# Patient Record
Sex: Female | Born: 1965 | Race: White | Hispanic: No | Marital: Married | Smoking: Never smoker
Health system: Southern US, Community
[De-identification: ages and names within clinical notes are randomized; demographics above are authoritative.]

## PROBLEM LIST (undated history)

## (undated) DIAGNOSIS — I82409 Acute embolism and thrombosis of unspecified deep veins of unspecified lower extremity: Secondary | ICD-10-CM

## (undated) DIAGNOSIS — I739 Peripheral vascular disease, unspecified: Secondary | ICD-10-CM

## (undated) DIAGNOSIS — E785 Hyperlipidemia, unspecified: Secondary | ICD-10-CM

## (undated) DIAGNOSIS — Z9889 Other specified postprocedural states: Secondary | ICD-10-CM

## (undated) DIAGNOSIS — F419 Anxiety disorder, unspecified: Secondary | ICD-10-CM

## (undated) DIAGNOSIS — R112 Nausea with vomiting, unspecified: Secondary | ICD-10-CM

## (undated) HISTORY — PX: ORIF FOREARM FRACTURE: SHX2124

## (undated) HISTORY — PX: APPENDECTOMY: SHX54

## (undated) HISTORY — DX: Peripheral vascular disease, unspecified: I73.9

---

## 1998-09-02 DIAGNOSIS — E782 Mixed hyperlipidemia: Secondary | ICD-10-CM | POA: Insufficient documentation

## 2006-07-13 ENCOUNTER — Emergency Department (HOSPITAL_COMMUNITY): Admission: EM | Admit: 2006-07-13 | Discharge: 2006-07-13 | Payer: Self-pay | Admitting: Emergency Medicine

## 2007-05-13 ENCOUNTER — Emergency Department (HOSPITAL_COMMUNITY): Admission: EM | Admit: 2007-05-13 | Discharge: 2007-05-14 | Payer: Self-pay | Admitting: Emergency Medicine

## 2008-12-10 ENCOUNTER — Ambulatory Visit: Payer: Self-pay | Admitting: Diagnostic Radiology

## 2008-12-10 ENCOUNTER — Emergency Department (HOSPITAL_BASED_OUTPATIENT_CLINIC_OR_DEPARTMENT_OTHER): Admission: EM | Admit: 2008-12-10 | Discharge: 2008-12-10 | Payer: Self-pay | Admitting: Emergency Medicine

## 2008-12-10 ENCOUNTER — Ambulatory Visit (HOSPITAL_COMMUNITY): Admission: RE | Admit: 2008-12-10 | Discharge: 2008-12-10 | Payer: Self-pay | Admitting: Emergency Medicine

## 2008-12-10 ENCOUNTER — Ambulatory Visit: Payer: Self-pay | Admitting: Vascular Surgery

## 2008-12-10 ENCOUNTER — Encounter (INDEPENDENT_AMBULATORY_CARE_PROVIDER_SITE_OTHER): Payer: Self-pay | Admitting: Emergency Medicine

## 2010-03-17 ENCOUNTER — Emergency Department (HOSPITAL_BASED_OUTPATIENT_CLINIC_OR_DEPARTMENT_OTHER): Admission: EM | Admit: 2010-03-17 | Discharge: 2010-03-17 | Payer: Self-pay | Admitting: Emergency Medicine

## 2010-03-17 ENCOUNTER — Ambulatory Visit: Payer: Self-pay | Admitting: Diagnostic Radiology

## 2010-03-29 ENCOUNTER — Encounter: Admission: RE | Admit: 2010-03-29 | Discharge: 2010-03-29 | Payer: Self-pay | Admitting: Orthopedic Surgery

## 2010-12-12 LAB — BASIC METABOLIC PANEL
BUN: 9 mg/dL (ref 6–23)
Creatinine, Ser: 0.8 mg/dL (ref 0.4–1.2)
GFR calc non Af Amer: >60 mL/min (ref >60)
Glucose, Bld: 136 mg/dL — ABNORMAL HIGH (ref 70–99)
Potassium: 4 mEq/L (ref 3.5–5.1)

## 2010-12-12 LAB — DIFFERENTIAL
Basophils Relative: 0 % (ref 0–1)
Monocytes Absolute: 0.6 10*3/uL (ref 0.1–1.0)
Monocytes Relative: 6 % (ref 3–12)
Neutrophils Relative %: 71 % (ref 43–77)

## 2010-12-12 LAB — D-DIMER, QUANTITATIVE: D-Dimer, Quant: 1.16 ug/mL-FEU — ABNORMAL HIGH (ref 0.00–0.48)

## 2010-12-12 LAB — CBC
HCT: 37.4 % (ref 36.0–46.0)
MCV: 90.7 fL (ref 78.0–100.0)
Platelets: 239 10*3/uL (ref 150–400)
RDW: 12.6 % (ref 11.5–15.5)
WBC: 8.8 10*3/uL (ref 4.0–10.5)

## 2011-09-24 ENCOUNTER — Other Ambulatory Visit: Payer: Self-pay | Admitting: Allergy and Immunology

## 2011-09-24 ENCOUNTER — Ambulatory Visit
Admission: RE | Admit: 2011-09-24 | Discharge: 2011-09-24 | Disposition: A | Discharge status: Home / Self Care | Payer: BC Managed Care – PPO | Source: Ambulatory Visit | Attending: Allergy and Immunology | Admitting: Allergy and Immunology

## 2011-09-24 DIAGNOSIS — R05 Cough: Secondary | ICD-10-CM

## 2011-09-24 DIAGNOSIS — R059 Cough, unspecified: Secondary | ICD-10-CM

## 2012-07-27 DIAGNOSIS — H5210 Myopia, unspecified eye: Secondary | ICD-10-CM | POA: Insufficient documentation

## 2012-08-20 DIAGNOSIS — M25552 Pain in left hip: Secondary | ICD-10-CM | POA: Insufficient documentation

## 2012-10-17 DIAGNOSIS — Z9889 Other specified postprocedural states: Secondary | ICD-10-CM | POA: Insufficient documentation

## 2013-07-30 DIAGNOSIS — J309 Allergic rhinitis, unspecified: Secondary | ICD-10-CM | POA: Insufficient documentation

## 2014-03-07 ENCOUNTER — Other Ambulatory Visit: Payer: Self-pay

## 2014-03-07 DIAGNOSIS — Z1231 Encounter for screening mammogram for malignant neoplasm of breast: Secondary | ICD-10-CM

## 2014-03-29 ENCOUNTER — Ambulatory Visit
Admission: RE | Admit: 2014-03-29 | Discharge: 2014-03-29 | Disposition: A | Discharge status: Home / Self Care | Payer: BC Managed Care – PPO | Source: Ambulatory Visit

## 2014-03-29 ENCOUNTER — Encounter (INDEPENDENT_AMBULATORY_CARE_PROVIDER_SITE_OTHER): Payer: Self-pay

## 2014-03-29 DIAGNOSIS — Z1231 Encounter for screening mammogram for malignant neoplasm of breast: Secondary | ICD-10-CM

## 2014-04-01 DIAGNOSIS — L509 Urticaria, unspecified: Secondary | ICD-10-CM | POA: Insufficient documentation

## 2014-04-01 DIAGNOSIS — E559 Vitamin D deficiency, unspecified: Secondary | ICD-10-CM | POA: Insufficient documentation

## 2014-04-21 DIAGNOSIS — Z880 Allergy status to penicillin: Secondary | ICD-10-CM | POA: Insufficient documentation

## 2014-04-21 DIAGNOSIS — L501 Idiopathic urticaria: Secondary | ICD-10-CM | POA: Insufficient documentation

## 2015-04-27 DIAGNOSIS — H43813 Vitreous degeneration, bilateral: Secondary | ICD-10-CM | POA: Insufficient documentation

## 2015-05-05 DIAGNOSIS — E785 Hyperlipidemia, unspecified: Secondary | ICD-10-CM | POA: Insufficient documentation

## 2015-06-01 DIAGNOSIS — H35413 Lattice degeneration of retina, bilateral: Secondary | ICD-10-CM | POA: Insufficient documentation

## 2015-06-01 DIAGNOSIS — H47323 Drusen of optic disc, bilateral: Secondary | ICD-10-CM | POA: Insufficient documentation

## 2015-06-01 DIAGNOSIS — H35372 Puckering of macula, left eye: Secondary | ICD-10-CM | POA: Insufficient documentation

## 2017-02-28 ENCOUNTER — Other Ambulatory Visit: Payer: Self-pay | Admitting: Neurological Surgery

## 2017-02-28 DIAGNOSIS — M545 Low back pain: Secondary | ICD-10-CM

## 2017-03-14 ENCOUNTER — Ambulatory Visit
Admission: RE | Admit: 2017-03-14 | Discharge: 2017-03-14 | Disposition: A | Discharge status: Home / Self Care | Payer: BLUE CROSS/BLUE SHIELD | Source: Ambulatory Visit | Attending: Neurological Surgery | Admitting: Neurological Surgery

## 2017-03-14 DIAGNOSIS — M545 Low back pain: Secondary | ICD-10-CM

## 2017-03-20 ENCOUNTER — Other Ambulatory Visit: Payer: Self-pay | Admitting: Physical Medicine and Rehabilitation

## 2017-03-20 DIAGNOSIS — M7072 Other bursitis of hip, left hip: Secondary | ICD-10-CM

## 2017-03-24 ENCOUNTER — Ambulatory Visit
Admission: RE | Admit: 2017-03-24 | Discharge: 2017-03-24 | Disposition: A | Discharge status: Home / Self Care | Payer: BLUE CROSS/BLUE SHIELD | Source: Ambulatory Visit | Attending: Physical Medicine and Rehabilitation | Admitting: Physical Medicine and Rehabilitation

## 2017-03-24 DIAGNOSIS — M7072 Other bursitis of hip, left hip: Secondary | ICD-10-CM

## 2017-06-17 DIAGNOSIS — Z86718 Personal history of other venous thrombosis and embolism: Secondary | ICD-10-CM | POA: Insufficient documentation

## 2017-06-17 DIAGNOSIS — I82402 Acute embolism and thrombosis of unspecified deep veins of left lower extremity: Secondary | ICD-10-CM | POA: Insufficient documentation

## 2017-06-19 DIAGNOSIS — I82409 Acute embolism and thrombosis of unspecified deep veins of unspecified lower extremity: Secondary | ICD-10-CM | POA: Insufficient documentation

## 2017-06-19 DIAGNOSIS — I87002 Postthrombotic syndrome without complications of left lower extremity: Secondary | ICD-10-CM | POA: Insufficient documentation

## 2017-06-25 DIAGNOSIS — Z7901 Long term (current) use of anticoagulants: Secondary | ICD-10-CM | POA: Insufficient documentation

## 2017-10-06 DIAGNOSIS — E669 Obesity, unspecified: Secondary | ICD-10-CM | POA: Insufficient documentation

## 2017-10-06 DIAGNOSIS — G589 Mononeuropathy, unspecified: Secondary | ICD-10-CM | POA: Insufficient documentation

## 2017-10-29 ENCOUNTER — Other Ambulatory Visit: Payer: Self-pay | Admitting: Neurology

## 2017-10-29 DIAGNOSIS — G509 Disorder of trigeminal nerve, unspecified: Secondary | ICD-10-CM

## 2017-11-12 ENCOUNTER — Ambulatory Visit
Admission: RE | Admit: 2017-11-12 | Discharge: 2017-11-12 | Disposition: A | Discharge status: Home / Self Care | Payer: BLUE CROSS/BLUE SHIELD | Source: Ambulatory Visit | Attending: Chiropractic Medicine | Admitting: Chiropractic Medicine

## 2017-11-12 DIAGNOSIS — G509 Disorder of trigeminal nerve, unspecified: Secondary | ICD-10-CM

## 2017-11-12 MED ORDER — GADOBENATE DIMEGLUMINE 529 MG/ML IV SOLN
20.0000 mL | Freq: Once | INTRAVENOUS | Status: AC | PRN
  Administered 2017-11-12: 20 mL via INTRAVENOUS

## 2018-04-09 DIAGNOSIS — M816 Localized osteoporosis [Lequesne]: Secondary | ICD-10-CM | POA: Insufficient documentation

## 2019-02-16 ENCOUNTER — Telehealth: Payer: Self-pay | Admitting: Hematology

## 2019-02-16 NOTE — Telephone Encounter (Signed)
Called and spoke with patient regarding appointments/ Patient is aware of date/time/location

## 2019-02-18 NOTE — Progress Notes (Signed)
[place] NOTE  Patient Care Team: Jamesetta Geralds, MD as PCP - General (Family Medicine)  HEME/ONC OVERVIEW: 1. Hx of provoked LLE DVT  -06/2017: acute DVT involving L popliteal vein and the calf vessels, provoked in the setting of OCP use; treated with Xarelto  -12/2017: stranding in the L popliteal vein on doppler  -10/2018: no evidence of residual DVT or stranding in the LLE  TREATMENT REGIMEN:  06/2017 - present: Xarelto; on 10mg  daily since 03/2019 for secondary prophylaxis   ASSESSMENT & PLAN:   Hx of LLE DVT  -I reviewed the patient's records in detail, including external clinic notes, lab studies, and imaging results -In summary, patient presented for new onset left lower extremity swelling in late 2018, and Doppler in 06/2017 showed acute DVT involving the left popliteal vein and calf vessels.  The patient was started on Xarelto and has been on 20mg  daily since then.  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  -I reviewed with the patient about the plan for care for DVT -This last episode of blood clot appeared to be provoked in the setting of OCP use.  While the duration of anticoagulation for the 1st episode of provoked VTE is generally finite, the patient has severe post-phlebitis syndrome, which can increase the risk of recurrent DVT.  Furthermore, if she develops a recurrent episode of DVT, it can further worsen the post-phlebitis syndrome and cause significant impact on her quality of life.  Therefore, it would be  reasonable to continue anticoagulation indefinitely in her case. -We reviewed the literature on the dosing management of Xarelto, including reducing the dose of Xarelto by half (ie 10mg  daily) for secondary prophylaxis -After extensive discussions regarding the pros and cons of the lower dose of Xarelto for secondary prophylaxis, patient expressed understanding and agreed to reduce the dose of Xarelto to 10mg  daily -I recommend the patient to continue to use elastic compression stockings at 20-30 mmHg to reduce risks of chronic thrombophlebitis. -Finally, I reinforced the importance of preventive strategies such as avoiding hormonal supplement, avoiding cigarette smoking, keeping up-to-date with screening programs for early cancer detection, frequent ambulation for long distance travel and aggressive DVT prophylaxis in all surgical settings. -Should she need any interruption of the anticoagulation for elective procedures in the future, feel free to contact me regarding peri-operative management.  Orders Placed This Encounter  Procedures  . CBC with Differential (Cancer Center Only)    Standing Status:   Future    Standing Expiration Date:   04/01/2020  . CMP ([facility] only)    Standing Status:   Future    Standing Expiration Date:   04/01/2020   All questions were answered. The patient knows to call the clinic with any problems, questions or concerns.  Return in 6 months for labs and clinic follow-up.   Tish Men, MD 02/26/2019 9:42 AM   CHIEF COMPLAINTS/PURPOSE OF CONSULTATION:  "I am about the same"  HISTORY OF PRESENTING ILLNESS:  Monica Perez 53 y.o. female is here because of history of LLE DVT.  Patient reports that she was diagnosed with left  lower extremity DVT in May 2018 after she had several weeks of left heel pain.  She was started on Xarelto at that time and has remained on it since then without any abnormal bleeding or bruising.  However, despite resolution of the DVT,  she has developed severe postphlebitic syndrome, for which she has been [facility] for possible vein stripping/ablation.  Unfortunately, both institutions felt that she was not a surgical candidate due to the location of the venous insufficiency.  She has tried a variety of supportive care measures, including compression stockings and electronic compression device, without significant improvement.  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  As a result, she has to keep her left leg elevated throughout the weekend for the swelling to go down before she goes back to work on Monday.  She denies any complaint today.  MEDICAL HISTORY:  History reviewed. No pertinent past medical history.  SURGICAL HISTORY: History reviewed. No pertinent surgical history.  SOCIAL HISTORY: Social History   Socioeconomic History  . Marital status: Married    Spouse name: Not on file  . Number of children: Not on file  . Years of education: Not on file  . Highest education level: Not on file  Occupational History  . Not on file  Social Needs  . Financial resource strain: Not on file  . Food insecurity    Worry: Not on file    Inability: Not on file  . Transportation needs    Medical: Not on file    Non-medical: Not on file  Tobacco Use  . Smoking status: Never Smoker  . Smokeless tobacco: Never Used  Substance and Sexual Activity  . Alcohol use: Not Currently  . Drug use: Never  . Sexual activity: Not on file  Lifestyle  . Physical activity    Days per week: Not on file    Minutes per session: Not on file  . Stress: Not on file  Relationships  . Social Musicianconnections    Talks on phone: Not on file    Gets together: Not on file    Attends religious service: Not on file    Active member of club or organization:  Not on file    Attends meetings of clubs or organizations: Not on file    Relationship status: Not on file  . Intimate partner violence    Fear of current or ex partner: Not on file    Emotionally abused: Not on file    Physically abused: Not on file    Forced sexual activity: Not on file  Other Topics Concern  . Not on file  Social History Narrative  . Not on file    FAMILY HISTORY: History reviewed. No pertinent family history.  ALLERGIES:  is allergic to aspirin; atovaquone-proguanil hcl; other; penicillins; erythromycin base; morphine and related; sulfa antibiotics; and sulfamethoxazole-trimethoprim.  MEDICATIONS:  Current Outpatient Medications  Medication Sig Dispense Refill  . desvenlafaxine (PRISTIQ) 50 MG 24 hr tablet Take by mouth.    . Dietary Management Product (VASCULERA PO) Take 1 tablet by mouth daily.    Marland Kitchen. lovastatin (MEVACOR) 20 MG tablet Take 1 tablet by mouth daily.    . Multiple Vitamin (MULTI-VITAMIN DAILY PO) Take 1 tablet by mouth daily.    Marland Kitchen. VITAMIN D,  CHOLECALCIFEROL, PO Take 4,000 Units by mouth daily.    Carlena Hurl. XARELTO 20 MG TABS tablet Take 1 tablet by mouth daily.     No current facility-administered medications for this visit.     REVIEW OF SYSTEMS:   Constitutional: ( - ) fevers, ( - )  chills , ( - ) night sweats Eyes: ( - ) blurriness of vision, ( - ) double vision, ( - ) watery eyes Ears, nose, mouth, throat, and face: ( - ) mucositis, ( - ) sore throat Respiratory: ( - ) cough, ( - ) dyspnea, ( - ) wheezes Cardiovascular: ( - ) palpitation, ( - ) chest discomfort, ( + ) lower extremity swelling Gastrointestinal:  ( - ) nausea, ( - ) heartburn, ( - ) change in bowel habits Skin: ( - ) abnormal skin rashes Lymphatics: ( - ) new lymphadenopathy, ( - ) easy bruising Neurological: ( - ) numbness, ( - ) tingling, ( - ) new weaknesses Behavioral/Psych: ( - ) mood change, ( - ) new changes  All other systems were reviewed with the patient and are  negative.  PHYSICAL EXAMINATION: ECOG PERFORMANCE STATUS: 1 - Symptomatic but completely ambulatory  Vitals:   02/26/19 0923  BP: 117/82  Pulse: 87  Resp: 18  Temp: 98 F (36.7 C)  SpO2: 100%   Filed Weights   02/26/19 0923  Weight: 219 lb 12.8 oz (99.7 kg)    GENERAL: alert, no distress and comfortable SKIN: skin color, texture, turgor are normal, no rashes or significant lesions EYES: conjunctiva are pink and non-injected, sclera clear OROPHARYNX: no exudate, no erythema; lips, buccal mucosa, and tongue normal  NECK: supple, non-tender LUNGS: clear to auscultation with normal breathing effort HEART: regular rate & rhythm, no murmurs, no lower extremity edema ABDOMEN: soft, non-tender, non-distended, normal bowel sounds Musculoskeletal: no cyanosis of digits and no clubbing  PSYCH: alert & oriented x 3, fluent speech NEURO: no focal motor/sensory deficits  LABORATORY DATA:  I have reviewed the data as listed Lab Results  Component Value Date   WBC 8.9 02/26/2019   HGB 13.9 02/26/2019   HCT 42.0 02/26/2019   MCV 93.8 02/26/2019   PLT 292 02/26/2019   Lab Results  Component Value Date   NA 139 02/26/2019   K 4.4 02/26/2019   CL 103 02/26/2019   CO2 28 02/26/2019   PATHOLOGY: I have reviewed the pathology reports as documented in the oncologist history.

## 2019-02-25 ENCOUNTER — Other Ambulatory Visit: Payer: BLUE CROSS/BLUE SHIELD

## 2019-02-25 ENCOUNTER — Other Ambulatory Visit: Payer: Self-pay | Admitting: Hematology

## 2019-02-25 ENCOUNTER — Ambulatory Visit: Payer: BLUE CROSS/BLUE SHIELD | Admitting: Hematology

## 2019-02-25 DIAGNOSIS — Z86718 Personal history of other venous thrombosis and embolism: Secondary | ICD-10-CM

## 2019-02-26 ENCOUNTER — Inpatient Hospital Stay (HOSPITAL_BASED_OUTPATIENT_CLINIC_OR_DEPARTMENT_OTHER): Payer: BC Managed Care – PPO | Admitting: Hematology

## 2019-02-26 ENCOUNTER — Telehealth: Payer: Self-pay | Admitting: Hematology

## 2019-02-26 ENCOUNTER — Inpatient Hospital Stay: Payer: BC Managed Care – PPO | Attending: Hematology

## 2019-02-26 ENCOUNTER — Encounter: Payer: Self-pay | Admitting: Hematology

## 2019-02-26 ENCOUNTER — Other Ambulatory Visit: Payer: Self-pay

## 2019-02-26 VITALS — BP 117/82 | HR 87 | Temp 98.0°F | Resp 18 | Ht 66.0 in | Wt 219.8 lb

## 2019-02-26 DIAGNOSIS — Z7901 Long term (current) use of anticoagulants: Secondary | ICD-10-CM | POA: Insufficient documentation

## 2019-02-26 DIAGNOSIS — I87002 Postthrombotic syndrome without complications of left lower extremity: Secondary | ICD-10-CM | POA: Diagnosis not present

## 2019-02-26 DIAGNOSIS — Z86718 Personal history of other venous thrombosis and embolism: Secondary | ICD-10-CM | POA: Diagnosis present

## 2019-02-26 LAB — CBC WITH DIFFERENTIAL (CANCER CENTER ONLY)
Abs Immature Granulocytes: 0.02 10*3/uL (ref 0.00–0.07)
Basophils Absolute: 0.1 10*3/uL (ref 0.0–0.1)
Basophils Relative: 1 %
Eosinophils Absolute: 0.2 10*3/uL (ref 0.0–0.5)
Eosinophils Relative: 3 %
HCT: 42 % (ref 36.0–46.0)
Hemoglobin: 13.9 g/dL (ref 12.0–15.0)
Immature Granulocytes: 0 %
Lymphocytes Relative: 32 %
Lymphs Abs: 2.9 10*3/uL (ref 0.7–4.0)
MCH: 31 pg (ref 26.0–34.0)
MCHC: 33.1 g/dL (ref 30.0–36.0)
MCV: 93.8 fL (ref 80.0–100.0)
Monocytes Absolute: 0.8 10*3/uL (ref 0.1–1.0)
Monocytes Relative: 9 %
Neutro Abs: 4.9 10*3/uL (ref 1.7–7.7)
Neutrophils Relative %: 55 %
Platelet Count: 292 10*3/uL (ref 150–400)
RBC: 4.48 MIL/uL (ref 3.87–5.11)
RDW: 13.1 % (ref 11.5–15.5)
WBC Count: 8.9 10*3/uL (ref 4.0–10.5)
nRBC: 0 % (ref 0.0–0.2)

## 2019-02-26 LAB — CMP (CANCER CENTER ONLY)
ALT: 28 U/L (ref 0–44)
AST: 23 U/L (ref 15–41)
Albumin: 4.4 g/dL (ref 3.5–5.0)
Alkaline Phosphatase: 67 U/L (ref 38–126)
Anion gap: 8 (ref 5–15)
BUN: 22 mg/dL — ABNORMAL HIGH (ref 6–20)
CO2: 28 mmol/L (ref 22–32)
Calcium: 10.1 mg/dL (ref 8.9–10.3)
Chloride: 103 mmol/L (ref 98–111)
Creatinine: 0.93 mg/dL (ref 0.44–1.00)
GFR, Est AFR Am: >60 mL/min (ref >60)
GFR, Estimated: >60 mL/min (ref >60)
Glucose, Bld: 111 mg/dL — ABNORMAL HIGH (ref 70–99)
Potassium: 4.4 mmol/L (ref 3.5–5.1)
Sodium: 139 mmol/L (ref 135–145)
Total Bilirubin: 0.8 mg/dL (ref 0.3–1.2)
Total Protein: 7.2 g/dL (ref 6.5–8.1)

## 2019-02-26 LAB — D-DIMER, QUANTITATIVE: D-Dimer, Quant: <0.27 ug/mL-FEU (ref 0.00–0.50)

## 2019-02-26 LAB — SAVE SMEAR (SSMR)

## 2019-02-26 MED ORDER — RIVAROXABAN 10 MG PO TABS: 10.0000 mg | ORAL_TABLET | Freq: Every day | ORAL | 11 refills | Status: DC

## 2019-02-26 NOTE — Telephone Encounter (Signed)
Called and LMVM for patient with date/tim of next appointment per 6/26 los

## 2019-03-01 ENCOUNTER — Other Ambulatory Visit: Payer: Self-pay | Admitting: *Deleted

## 2019-03-01 MED ORDER — RIVAROXABAN 10 MG PO TABS: 10.0000 mg | ORAL_TABLET | Freq: Every day | ORAL | 3 refills | Status: DC

## 2019-08-13 ENCOUNTER — Other Ambulatory Visit: Payer: Self-pay

## 2019-08-13 ENCOUNTER — Inpatient Hospital Stay (HOSPITAL_BASED_OUTPATIENT_CLINIC_OR_DEPARTMENT_OTHER): Payer: BC Managed Care – PPO | Admitting: Family

## 2019-08-13 ENCOUNTER — Inpatient Hospital Stay: Payer: BC Managed Care – PPO | Attending: Family

## 2019-08-13 VITALS — BP 108/85 | HR 102 | Temp 97.3°F | Resp 18 | Ht 66.0 in | Wt 226.0 lb

## 2019-08-13 DIAGNOSIS — Z86718 Personal history of other venous thrombosis and embolism: Secondary | ICD-10-CM

## 2019-08-13 DIAGNOSIS — Z7901 Long term (current) use of anticoagulants: Secondary | ICD-10-CM | POA: Diagnosis not present

## 2019-08-13 LAB — CBC WITH DIFFERENTIAL (CANCER CENTER ONLY)
Abs Immature Granulocytes: 0.03 10*3/uL (ref 0.00–0.07)
Basophils Absolute: 0.1 10*3/uL (ref 0.0–0.1)
Basophils Relative: 1 %
Eosinophils Absolute: 0.2 10*3/uL (ref 0.0–0.5)
Eosinophils Relative: 3 %
HCT: 43.7 % (ref 36.0–46.0)
Hemoglobin: 14.4 g/dL (ref 12.0–15.0)
Immature Granulocytes: 0 %
Lymphocytes Relative: 31 %
Lymphs Abs: 2.6 10*3/uL (ref 0.7–4.0)
MCH: 31.2 pg (ref 26.0–34.0)
MCHC: 33 g/dL (ref 30.0–36.0)
MCV: 94.8 fL (ref 80.0–100.0)
Monocytes Absolute: 0.7 10*3/uL (ref 0.1–1.0)
Monocytes Relative: 9 %
Neutro Abs: 4.6 10*3/uL (ref 1.7–7.7)
Neutrophils Relative %: 56 %
Platelet Count: 336 10*3/uL (ref 150–400)
RBC: 4.61 MIL/uL (ref 3.87–5.11)
RDW: 13.7 % (ref 11.5–15.5)
WBC Count: 8.2 10*3/uL (ref 4.0–10.5)
nRBC: 0 % (ref 0.0–0.2)

## 2019-08-13 LAB — CMP (CANCER CENTER ONLY)
ALT: 47 U/L — ABNORMAL HIGH (ref 0–44)
AST: 33 U/L (ref 15–41)
Albumin: 5.1 g/dL — ABNORMAL HIGH (ref 3.5–5.0)
Alkaline Phosphatase: 68 U/L (ref 38–126)
Anion gap: 9 (ref 5–15)
BUN: 18 mg/dL (ref 6–20)
CO2: 28 mmol/L (ref 22–32)
Calcium: 10.4 mg/dL — ABNORMAL HIGH (ref 8.9–10.3)
Chloride: 102 mmol/L (ref 98–111)
Creatinine: 0.99 mg/dL (ref 0.44–1.00)
GFR, Est AFR Am: >60 mL/min (ref >60)
GFR, Estimated: >60 mL/min (ref >60)
Glucose, Bld: 113 mg/dL — ABNORMAL HIGH (ref 70–99)
Potassium: 4.3 mmol/L (ref 3.5–5.1)
Sodium: 139 mmol/L (ref 135–145)
Total Bilirubin: 0.6 mg/dL (ref 0.3–1.2)
Total Protein: 7.8 g/dL (ref 6.5–8.1)

## 2019-08-13 MED ORDER — RIVAROXABAN 10 MG PO TABS: 10.0000 mg | ORAL_TABLET | Freq: Every day | ORAL | 6 refills | Status: DC

## 2019-08-13 NOTE — Progress Notes (Signed)
Hematology and Oncology Follow Up Visit  Monica Perez [id number] 07-09-1966 53 y.o. 08/13/2019   Principle Diagnosis:  History of left lower extremity DVT with stranding in the left popliteal vein - secondary to OCP  Current Therapy:   Maintenance Xarelto 10 mg PO daily - started 03/2019   Interim History:  [name] is here today for follow-up. She is doing fairly well but has chronic pain and swelling in the right leg that waxes and wanes throughout the day due to postphlebitic syndrome. She states that she has seen vascular and is not a candidate for surgery.  Pedal pulses are 2+. She verbalized that she is taking Xarelto 10 mg PO daily as prescribed. No episodes of bleeding. No bruising or petechiae.  No fever, chills, n/v, cough, rash, dizziness, SOB, chest pain, palpitations, abdominal pain or changes in bowel or bladder habits.   No falls or syncopal episodes.  She has maintained a good appetite and is hydrating well. Her weight is stable at 226 lbs.   ECOG Performance Status: 1 - Symptomatic but completely ambulatory  Medications:  Allergies as of 08/13/2019      Reactions   Aspirin Hives   Atovaquone-proguanil Hcl Nausea Only   Other Hives   Surgical glue   Penicillins Hives, Rash      Erythromycin Base Hives, Rash      Morphine And Related Hives, Rash   Sulfa Antibiotics Hives, Rash      Sulfamethoxazole-trimethoprim Hives, Rash         Medication List       Accurate as of August 13, 2019  8:42 AM. If you have any questions, ask your nurse or doctor.        lovastatin 20 MG tablet Commonly known as: MEVACOR Take 1 tablet by mouth daily.   MULTI-VITAMIN DAILY PO Take 1 tablet by mouth daily.   Pristiq 50 MG 24 hr tablet Generic drug: desvenlafaxine Take by mouth.   rivaroxaban 10 MG Tabs tablet Commonly known as: Xarelto Take 1 tablet (10 mg total) by mouth daily.   VITAMIN D (CHOLECALCIFEROL) PO Take 4,000 Units by mouth daily.         Allergies:  Allergies  Allergen Reactions  . Aspirin Hives  . Atovaquone-Proguanil Hcl Nausea Only  . Other Hives    Surgical glue  . Penicillins Hives and Rash       . Erythromycin Base Hives and Rash       . Morphine And Related Hives and Rash  . Sulfa Antibiotics Hives and Rash       . Sulfamethoxazole-Trimethoprim Hives and Rash         Past Medical History, Surgical history, Social history, and Family History were reviewed and updated.  Review of Systems: All other 10 point review of systems is negative.   Physical Exam:  vitals were not taken for this visit.   Wt Readings from Last 3 Encounters:  02/26/19 219 lb 12.8 oz (99.7 kg)    Ocular: Sclerae unicteric, pupils equal, round and reactive to light Ear-nose-throat: Oropharynx clear, dentition fair Lymphatic: No cervical or supraclavicular adenopathy Lungs no rales or rhonchi, good excursion bilaterally Heart regular rate and rhythm, no murmur appreciated Abd soft, nontender, positive bowel sounds, no liver or spleen tip noted on exam, no fluid wave MSK no focal spinal tenderness, no joint edema Neuro: non-focal, well-oriented, appropriate affect Breasts: Deferred  Lab Results  Component Value Date   WBC 8.2 08/13/2019   HGB  14.4 08/13/2019   HCT 43.7 08/13/2019   MCV 94.8 08/13/2019   PLT 336 08/13/2019   No results found for: FERRITIN, IRON, TIBC, UIBC, IRONPCTSAT Lab Results  Component Value Date   RBC 4.61 08/13/2019   No results found for: KPAFRELGTCHN, LAMBDASER, KAPLAMBRATIO No results found for: IGGSERUM, IGA, IGMSERUM No results found for: Marda Stalker, SPEI   Chemistry      Component Value Date/Time   NA 139 02/26/2019 0829   K 4.4 02/26/2019 0829   CL 103 02/26/2019 0829   CO2 28 02/26/2019 0829   BUN 22 (H) 02/26/2019 0829   CREATININE 0.93 02/26/2019 0829      Component Value Date/Time   CALCIUM 10.1 02/26/2019 0829    ALKPHOS 67 02/26/2019 0829   AST 23 02/26/2019 0829   ALT 28 02/26/2019 0829   BILITOT 0.8 02/26/2019 0829       Impression and Plan: [name] is a very pleasant 53 yo caucasian female with history of LLE DVT and with stranding in the left popliteal vein, secondary to OCP.  She continues to do well and is following up with vascular for post phlebitic pain and swelling.  She does not feel that she needs to continue to come here as it is very expensive for her. She will continue to see vascular and her PCP. They will take over her Xarelto prescription refills and management after today.   She will contact our office with any questions or concerns and is always welcome to return here for care if needed.   Emeline Gins, NP 12/11/20208:42 AM

## 2019-08-19 DIAGNOSIS — Z5320 Procedure and treatment not carried out because of patient's decision for unspecified reasons: Secondary | ICD-10-CM | POA: Insufficient documentation

## 2019-08-20 ENCOUNTER — Other Ambulatory Visit: Payer: BLUE CROSS/BLUE SHIELD

## 2019-08-20 ENCOUNTER — Ambulatory Visit: Payer: BLUE CROSS/BLUE SHIELD | Admitting: Hematology

## 2020-02-02 DIAGNOSIS — M25512 Pain in left shoulder: Secondary | ICD-10-CM | POA: Insufficient documentation

## 2020-03-15 DIAGNOSIS — F419 Anxiety disorder, unspecified: Secondary | ICD-10-CM | POA: Insufficient documentation

## 2020-06-15 ENCOUNTER — Other Ambulatory Visit: Payer: Self-pay | Admitting: Family

## 2020-06-15 DIAGNOSIS — Z86718 Personal history of other venous thrombosis and embolism: Secondary | ICD-10-CM

## 2020-06-16 ENCOUNTER — Other Ambulatory Visit: Payer: Self-pay | Admitting: *Deleted

## 2020-06-16 ENCOUNTER — Inpatient Hospital Stay: Payer: BC Managed Care – PPO

## 2020-06-16 ENCOUNTER — Inpatient Hospital Stay: Payer: BC Managed Care – PPO | Attending: Family | Admitting: Family

## 2020-06-16 ENCOUNTER — Other Ambulatory Visit: Payer: Self-pay

## 2020-06-16 ENCOUNTER — Telehealth: Payer: Self-pay | Admitting: Family

## 2020-06-16 ENCOUNTER — Encounter: Payer: Self-pay | Admitting: Family

## 2020-06-16 VITALS — BP 130/88 | HR 85 | Temp 99.1°F | Resp 18 | Ht 66.0 in | Wt 230.0 lb

## 2020-06-16 DIAGNOSIS — Z86718 Personal history of other venous thrombosis and embolism: Secondary | ICD-10-CM

## 2020-06-16 DIAGNOSIS — Z7901 Long term (current) use of anticoagulants: Secondary | ICD-10-CM | POA: Diagnosis not present

## 2020-06-16 DIAGNOSIS — I87009 Postthrombotic syndrome without complications of unspecified extremity: Secondary | ICD-10-CM | POA: Diagnosis not present

## 2020-06-16 LAB — CMP (CANCER CENTER ONLY)
ALT: 64 U/L — ABNORMAL HIGH (ref 0–44)
AST: 32 U/L (ref 15–41)
Albumin: 4.2 g/dL (ref 3.5–5.0)
Alkaline Phosphatase: 69 U/L (ref 38–126)
Anion gap: 7 (ref 5–15)
BUN: 13 mg/dL (ref 6–20)
CO2: 29 mmol/L (ref 22–32)
Calcium: 10 mg/dL (ref 8.9–10.3)
Chloride: 106 mmol/L (ref 98–111)
Creatinine: 0.87 mg/dL (ref 0.44–1.00)
GFR, Estimated: >60 mL/min (ref >60)
Glucose, Bld: 138 mg/dL — ABNORMAL HIGH (ref 70–99)
Potassium: 4.1 mmol/L (ref 3.5–5.1)
Sodium: 142 mmol/L (ref 135–145)
Total Bilirubin: 0.6 mg/dL (ref 0.3–1.2)
Total Protein: 6.5 g/dL (ref 6.5–8.1)

## 2020-06-16 LAB — CBC WITH DIFFERENTIAL (CANCER CENTER ONLY)
Abs Immature Granulocytes: 0.04 10*3/uL (ref 0.00–0.07)
Basophils Absolute: 0.1 10*3/uL (ref 0.0–0.1)
Basophils Relative: 1 %
Eosinophils Absolute: 0.3 10*3/uL (ref 0.0–0.5)
Eosinophils Relative: 4 %
HCT: 40.8 % (ref 36.0–46.0)
Hemoglobin: 13.1 g/dL (ref 12.0–15.0)
Immature Granulocytes: 1 %
Lymphocytes Relative: 26 %
Lymphs Abs: 2.3 10*3/uL (ref 0.7–4.0)
MCH: 30.7 pg (ref 26.0–34.0)
MCHC: 32.1 g/dL (ref 30.0–36.0)
MCV: 95.6 fL (ref 80.0–100.0)
Monocytes Absolute: 0.7 10*3/uL (ref 0.1–1.0)
Monocytes Relative: 8 %
Neutro Abs: 5.5 10*3/uL (ref 1.7–7.7)
Neutrophils Relative %: 60 %
Platelet Count: 243 10*3/uL (ref 150–400)
RBC: 4.27 MIL/uL (ref 3.87–5.11)
RDW: 13.2 % (ref 11.5–15.5)
WBC Count: 8.8 10*3/uL (ref 4.0–10.5)
nRBC: 0 % (ref 0.0–0.2)

## 2020-06-16 LAB — D-DIMER, QUANTITATIVE: D-Dimer, Quant: <0.27 ug/mL-FEU (ref 0.00–0.50)

## 2020-06-16 MED ORDER — RIVAROXABAN 10 MG PO TABS: 10.0000 mg | ORAL_TABLET | Freq: Every day | ORAL | 6 refills | Status: DC

## 2020-06-16 NOTE — Telephone Encounter (Signed)
Appointments scheduled calendar printed & mailed per 10/15 los 

## 2020-06-16 NOTE — Progress Notes (Signed)
Hematology and Oncology Follow Up Visit  Monica Perez [id number] 26-Dec-1965 54 y.o. 06/16/2020   Principle Diagnosis:  History of left lower extremity DVT with stranding in the left popliteal vein - secondary to OCP  Current Therapy:        Maintenance Xarelto 10 mg PO daily - started 03/2019   Interim History:  Monica Perez is here today for follow-up. She is still having a lot of problems with post phlebitic pain and swelling in her left leg. 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  She wears her compression stockings, elevates her leg when able and also has a lymphedema machine that she uses at night. These only give temporary relief. She states that she has pain in the ankle 24/7 and then intermittent pain from the knee down.  She is doing well on maintenance Xarelto and verbalized that she is taking as prescribed.  No episodes of bleeding. No bruising or petechiae.   No numbness or tingling in her extremities at this time.  Pedal pulses are 3+.  No falls or syncope.  No fever, chills, n/v, cough, rash, dizziness, SOB, chest pain, palpitations, abdominal pain or changes in bowel or bladder habits.  She has maintained a good appetite and is staying well hydrated. Her weight is stable.   ECOG Performance Status: 1 - Symptomatic but completely ambulatory  Medications:  Allergies as of 06/16/2020      Reactions   Aspirin Hives   Atovaquone-proguanil Hcl Nausea Only   Other Hives   Surgical glue   Penicillins Hives, Rash      Erythromycin Base Hives, Rash      Morphine And Related Hives, Rash   Sulfa Antibiotics Hives, Rash      Sulfamethoxazole-trimethoprim Hives, Rash         Medication List       Accurate as of June 16, 2020  9:47 AM. If you have any questions, ask your nurse or doctor.        desvenlafaxine 100 MG 24 hr tablet Commonly known as: PRISTIQ Take  100 mg by mouth daily.   lovastatin 20 MG tablet Commonly known as: MEVACOR Take 1 tablet by mouth daily.   MULTI-VITAMIN DAILY PO Take 1 tablet by mouth daily.   rivaroxaban 10 MG Tabs tablet Commonly known as: Xarelto Take 1 tablet (10 mg total) by mouth daily.   VITAMIN D (CHOLECALCIFEROL) PO Take 4,000 Units by mouth daily.       Allergies:  Allergies  Allergen Reactions  . Aspirin Hives  . Atovaquone-Proguanil Hcl Nausea Only  . Other Hives    Surgical glue  . Penicillins Hives and Rash       . Erythromycin Base Hives and Rash       . Morphine And Related Hives and Rash  . Sulfa Antibiotics Hives and Rash       . Sulfamethoxazole-Trimethoprim Hives and Rash         Past Medical History, Surgical history, Social history, and Family History were reviewed and updated.  Review of Systems: All other 10 point review of systems is negative.   Physical Exam:  height is 5\' 6"  (1.676 m) and weight is 230 lb (104.3 kg). Her oral temperature is 99.1 F (37.3 C). Her blood pressure is 130/88 and her pulse is 85. Her respiration is 18 and oxygen saturation is 98%.   Wt Readings from  Last 3 Encounters:  06/16/20 230 lb (104.3 kg)  08/13/19 226 lb (102.5 kg)  02/26/19 219 lb 12.8 oz (99.7 kg)    Ocular: Sclerae unicteric, pupils equal, round and reactive to light Ear-nose-throat: Oropharynx clear, dentition fair Lymphatic: No cervical or supraclavicular adenopathy Lungs no rales or rhonchi, good excursion bilaterally Heart regular rate and rhythm, no murmur appreciated Abd soft, nontender, positive bowel sounds MSK no focal spinal tenderness, no joint edema Neuro: non-focal, well-oriented, appropriate affect Breasts: Deferred   Lab Results  Component Value Date   WBC 8.8 06/16/2020   HGB 13.1 06/16/2020   HCT 40.8 06/16/2020   MCV 95.6 06/16/2020   PLT 243 06/16/2020   No results found for: FERRITIN, IRON, TIBC, UIBC, IRONPCTSAT Lab Results  Component  Value Date   RBC 4.27 06/16/2020   No results found for: KPAFRELGTCHN, LAMBDASER, KAPLAMBRATIO No results found for: IGGSERUM, IGA, IGMSERUM No results found for: Marda Stalker, SPEI   Chemistry      Component Value Date/Time   NA 139 08/13/2019 0821   K 4.3 08/13/2019 0821   CL 102 08/13/2019 0821   CO2 28 08/13/2019 0821   BUN 18 08/13/2019 0821   CREATININE 0.99 08/13/2019 0821      Component Value Date/Time   CALCIUM 10.4 (H) 08/13/2019 0821   ALKPHOS 68 08/13/2019 0821   AST 33 08/13/2019 0821   ALT 47 (H) 08/13/2019 0821   BILITOT 0.6 08/13/2019 0821       Impression and Plan: Monica Perez is a very pleasant 54 yo caucasian female with history of LLE DVT and with stranding in the left popliteal vein, secondary to OCP.  Referral placed for [facility]. Hopefully they will be able to offer her some options to treat the post phlebitis pain and swelling.  Xarelto refilled.  Follow-up in 6 months.  She can contact our office with any questions or concerns.   Emeline Gins, NP 10/15/20219:47 AM

## 2020-06-28 DIAGNOSIS — M21621 Bunionette of right foot: Secondary | ICD-10-CM | POA: Insufficient documentation

## 2020-06-29 ENCOUNTER — Other Ambulatory Visit (HOSPITAL_COMMUNITY): Payer: Self-pay | Admitting: Orthopedic Surgery

## 2020-07-13 ENCOUNTER — Encounter (HOSPITAL_BASED_OUTPATIENT_CLINIC_OR_DEPARTMENT_OTHER): Payer: Self-pay | Admitting: Orthopedic Surgery

## 2020-07-13 ENCOUNTER — Other Ambulatory Visit: Payer: Self-pay

## 2020-07-17 ENCOUNTER — Other Ambulatory Visit (HOSPITAL_COMMUNITY)
Admission: RE | Admit: 2020-07-17 | Discharge: 2020-07-17 | Disposition: A | Discharge status: Home / Self Care | Payer: BC Managed Care – PPO | Source: Ambulatory Visit | Attending: Orthopedic Surgery | Admitting: Orthopedic Surgery

## 2020-07-17 DIAGNOSIS — M21611 Bunion of right foot: Secondary | ICD-10-CM | POA: Diagnosis present

## 2020-07-17 DIAGNOSIS — Z20822 Contact with and (suspected) exposure to covid-19: Secondary | ICD-10-CM | POA: Diagnosis not present

## 2020-07-17 DIAGNOSIS — Z881 Allergy status to other antibiotic agents status: Secondary | ICD-10-CM | POA: Diagnosis not present

## 2020-07-17 DIAGNOSIS — Z01812 Encounter for preprocedural laboratory examination: Secondary | ICD-10-CM | POA: Insufficient documentation

## 2020-07-17 DIAGNOSIS — Z882 Allergy status to sulfonamides status: Secondary | ICD-10-CM | POA: Diagnosis not present

## 2020-07-17 DIAGNOSIS — Z885 Allergy status to narcotic agent status: Secondary | ICD-10-CM | POA: Diagnosis not present

## 2020-07-17 DIAGNOSIS — Z7901 Long term (current) use of anticoagulants: Secondary | ICD-10-CM | POA: Diagnosis not present

## 2020-07-17 DIAGNOSIS — Z886 Allergy status to analgesic agent status: Secondary | ICD-10-CM | POA: Diagnosis not present

## 2020-07-17 DIAGNOSIS — Z79899 Other long term (current) drug therapy: Secondary | ICD-10-CM | POA: Diagnosis not present

## 2020-07-17 DIAGNOSIS — Z880 Allergy status to penicillin: Secondary | ICD-10-CM | POA: Diagnosis not present

## 2020-07-17 DIAGNOSIS — M21621 Bunionette of right foot: Secondary | ICD-10-CM | POA: Diagnosis not present

## 2020-07-17 LAB — SARS CORONAVIRUS 2 (TAT 6-24 HRS): SARS Coronavirus 2: NEGATIVE

## 2020-07-20 ENCOUNTER — Encounter (HOSPITAL_BASED_OUTPATIENT_CLINIC_OR_DEPARTMENT_OTHER)
Admission: RE | Disposition: A | Discharge status: Home / Self Care | Payer: Self-pay | Source: Home / Self Care | Attending: Orthopedic Surgery

## 2020-07-20 ENCOUNTER — Encounter (HOSPITAL_BASED_OUTPATIENT_CLINIC_OR_DEPARTMENT_OTHER): Payer: Self-pay | Admitting: Orthopedic Surgery

## 2020-07-20 ENCOUNTER — Ambulatory Visit (HOSPITAL_BASED_OUTPATIENT_CLINIC_OR_DEPARTMENT_OTHER)
Admission: RE | Admit: 2020-07-20 | Discharge: 2020-07-20 | Disposition: A | Discharge status: Home / Self Care | Payer: BC Managed Care – PPO | Attending: Orthopedic Surgery | Admitting: Orthopedic Surgery

## 2020-07-20 ENCOUNTER — Ambulatory Visit (HOSPITAL_BASED_OUTPATIENT_CLINIC_OR_DEPARTMENT_OTHER): Payer: BC Managed Care – PPO | Admitting: Certified Registered"

## 2020-07-20 ENCOUNTER — Other Ambulatory Visit: Payer: Self-pay

## 2020-07-20 DIAGNOSIS — Z885 Allergy status to narcotic agent status: Secondary | ICD-10-CM | POA: Insufficient documentation

## 2020-07-20 DIAGNOSIS — Z7901 Long term (current) use of anticoagulants: Secondary | ICD-10-CM | POA: Insufficient documentation

## 2020-07-20 DIAGNOSIS — M21621 Bunionette of right foot: Secondary | ICD-10-CM | POA: Diagnosis not present

## 2020-07-20 DIAGNOSIS — Z882 Allergy status to sulfonamides status: Secondary | ICD-10-CM | POA: Insufficient documentation

## 2020-07-20 DIAGNOSIS — Z886 Allergy status to analgesic agent status: Secondary | ICD-10-CM | POA: Insufficient documentation

## 2020-07-20 DIAGNOSIS — Z20822 Contact with and (suspected) exposure to covid-19: Secondary | ICD-10-CM | POA: Insufficient documentation

## 2020-07-20 DIAGNOSIS — Z79899 Other long term (current) drug therapy: Secondary | ICD-10-CM | POA: Insufficient documentation

## 2020-07-20 DIAGNOSIS — Z881 Allergy status to other antibiotic agents status: Secondary | ICD-10-CM | POA: Insufficient documentation

## 2020-07-20 DIAGNOSIS — Z880 Allergy status to penicillin: Secondary | ICD-10-CM | POA: Insufficient documentation

## 2020-07-20 HISTORY — PX: BUNIONECTOMY: SHX129

## 2020-07-20 HISTORY — DX: Acute embolism and thrombosis of unspecified deep veins of unspecified lower extremity: I82.409

## 2020-07-20 HISTORY — DX: Hyperlipidemia, unspecified: E78.5

## 2020-07-20 HISTORY — DX: Anxiety disorder, unspecified: F41.9

## 2020-07-20 HISTORY — DX: Other specified postprocedural states: R11.2

## 2020-07-20 HISTORY — DX: Other specified postprocedural states: Z98.890

## 2020-07-20 SURGERY — BUNIONECTOMY
Anesthesia: Monitor Anesthesia Care | Site: Toe | Laterality: Right

## 2020-07-20 MED ORDER — LACTATED RINGERS IV SOLN: INTRAVENOUS | Status: DC

## 2020-07-20 MED ORDER — OXYCODONE HCL 5 MG/5ML PO SOLN: 5.0000 mg | Freq: Once | ORAL | Status: DC | PRN

## 2020-07-20 MED ORDER — SENNA 8.6 MG PO TABS: 2.0000 | ORAL_TABLET | Freq: Two times a day (BID) | ORAL | 0 refills | Status: DC

## 2020-07-20 MED ORDER — LIDOCAINE-EPINEPHRINE 1 %-1:100000 IJ SOLN
INTRAMUSCULAR | Status: AC
  Filled 2020-07-20: qty 1

## 2020-07-20 MED ORDER — HYDROMORPHONE HCL 1 MG/ML IJ SOLN: 0.2500 mg | INTRAMUSCULAR | Status: DC | PRN

## 2020-07-20 MED ORDER — VANCOMYCIN HCL 500 MG IV SOLR
INTRAVENOUS | Status: DC | PRN
  Administered 2020-07-20: 500 mg via TOPICAL

## 2020-07-20 MED ORDER — PROPOFOL 500 MG/50ML IV EMUL
INTRAVENOUS | Status: DC | PRN
  Administered 2020-07-20: 100 ug/kg/min via INTRAVENOUS

## 2020-07-20 MED ORDER — OXYCODONE HCL 5 MG PO TABS: 5.0000 mg | ORAL_TABLET | Freq: Once | ORAL | Status: DC | PRN

## 2020-07-20 MED ORDER — ROPIVACAINE HCL 5 MG/ML IJ SOLN
INTRAMUSCULAR | Status: DC | PRN
  Administered 2020-07-20: 30 mL via PERINEURAL

## 2020-07-20 MED ORDER — BUPIVACAINE HCL (PF) 0.5 % IJ SOLN
INTRAMUSCULAR | Status: AC
  Filled 2020-07-20: qty 30

## 2020-07-20 MED ORDER — SODIUM CHLORIDE 0.9 % IV SOLN: INTRAVENOUS | Status: DC

## 2020-07-20 MED ORDER — VANCOMYCIN HCL 500 MG IV SOLR
INTRAVENOUS | Status: AC
  Filled 2020-07-20: qty 500

## 2020-07-20 MED ORDER — FENTANYL CITRATE (PF) 100 MCG/2ML IJ SOLN
INTRAMUSCULAR | Status: AC
  Filled 2020-07-20: qty 2

## 2020-07-20 MED ORDER — CEFAZOLIN SODIUM-DEXTROSE 2-4 GM/100ML-% IV SOLN
2.0000 g | INTRAVENOUS | Status: AC
  Administered 2020-07-20: 2 g via INTRAVENOUS

## 2020-07-20 MED ORDER — DOCUSATE SODIUM 100 MG PO CAPS: 100.0000 mg | ORAL_CAPSULE | Freq: Two times a day (BID) | ORAL | 0 refills | Status: DC

## 2020-07-20 MED ORDER — ONDANSETRON HCL 4 MG/2ML IJ SOLN
INTRAMUSCULAR | Status: DC | PRN
  Administered 2020-07-20: 4 mg via INTRAVENOUS

## 2020-07-20 MED ORDER — MIDAZOLAM HCL 2 MG/2ML IJ SOLN
2.0000 mg | Freq: Once | INTRAMUSCULAR | Status: AC
  Administered 2020-07-20: 2 mg via INTRAVENOUS

## 2020-07-20 MED ORDER — MIDAZOLAM HCL 2 MG/2ML IJ SOLN
INTRAMUSCULAR | Status: AC
  Filled 2020-07-20: qty 2

## 2020-07-20 MED ORDER — LIDOCAINE HCL (CARDIAC) PF 100 MG/5ML IV SOSY
PREFILLED_SYRINGE | INTRAVENOUS | Status: DC | PRN
  Administered 2020-07-20: 30 mg via INTRAVENOUS

## 2020-07-20 MED ORDER — FENTANYL CITRATE (PF) 100 MCG/2ML IJ SOLN
100.0000 ug | Freq: Once | INTRAMUSCULAR | Status: AC
  Administered 2020-07-20: 100 ug via INTRAVENOUS

## 2020-07-20 MED ORDER — DEXMEDETOMIDINE (PRECEDEX) IN NS 20 MCG/5ML (4 MCG/ML) IV SYRINGE
PREFILLED_SYRINGE | INTRAVENOUS | Status: DC | PRN
  Administered 2020-07-20 (×2): 4 ug via INTRAVENOUS

## 2020-07-20 MED ORDER — 0.9 % SODIUM CHLORIDE (POUR BTL) OPTIME
TOPICAL | Status: DC | PRN
  Administered 2020-07-20: 120 mL

## 2020-07-20 MED ORDER — CEFAZOLIN SODIUM-DEXTROSE 2-4 GM/100ML-% IV SOLN
INTRAVENOUS | Status: AC
  Filled 2020-07-20: qty 100

## 2020-07-20 MED ORDER — PROMETHAZINE HCL 25 MG/ML IJ SOLN: 6.2500 mg | INTRAMUSCULAR | Status: DC | PRN

## 2020-07-20 MED ORDER — OXYCODONE HCL 5 MG PO TABS
5.0000 mg | ORAL_TABLET | Freq: Four times a day (QID) | ORAL | 0 refills | Status: AC | PRN
Start: 2020-07-20 — End: 2020-07-25

## 2020-07-20 SURGICAL SUPPLY — 77 items
APL PRP STRL LF DISP 70% ISPRP (MISCELLANEOUS) ×1
BANDAGE ESMARK 6X9 LF (GAUZE/BANDAGES/DRESSINGS) IMPLANT
BLADE AVERAGE 25X9 (BLADE) ×1 IMPLANT
BLADE LONG MED 25X9 (BLADE) IMPLANT
BLADE MICRO SAGITTAL (BLADE) IMPLANT
BLADE MINI RND TIP GREEN BEAV (BLADE) IMPLANT
BLADE OSC/SAG .038X5.5 CUT EDG (BLADE) IMPLANT
BLADE SURG 15 STRL LF DISP TIS (BLADE) ×2 IMPLANT
BLADE SURG 15 STRL SS (BLADE) ×4
BNDG CMPR 9X6 STRL LF SNTH (GAUZE/BANDAGES/DRESSINGS)
BNDG COHESIVE 4X5 TAN STRL (GAUZE/BANDAGES/DRESSINGS) ×2 IMPLANT
BNDG COHESIVE 6X5 TAN STRL LF (GAUZE/BANDAGES/DRESSINGS) IMPLANT
BNDG CONFORM 2 STRL LF (GAUZE/BANDAGES/DRESSINGS) IMPLANT
BNDG CONFORM 3 STRL LF (GAUZE/BANDAGES/DRESSINGS) ×2 IMPLANT
BNDG ESMARK 6X9 LF (GAUZE/BANDAGES/DRESSINGS)
CHLORAPREP W/TINT 26 (MISCELLANEOUS) ×2 IMPLANT
COVER BACK TABLE 60X90IN (DRAPES) ×2 IMPLANT
COVER WAND RF STERILE (DRAPES) IMPLANT
CUFF TOURN SGL QUICK 24 (TOURNIQUET CUFF)
CUFF TOURN SGL QUICK 34 (TOURNIQUET CUFF)
CUFF TOURN SGL QUICK 42 (TOURNIQUET CUFF) IMPLANT
CUFF TRNQT CYL 24X4X16.5-23 (TOURNIQUET CUFF) IMPLANT
CUFF TRNQT CYL 34X4.125X (TOURNIQUET CUFF) IMPLANT
DRAPE EXTREMITY T 121X128X90 (DISPOSABLE) ×2 IMPLANT
DRAPE OEC MINIVIEW 54X84 (DRAPES) ×2 IMPLANT
DRAPE U-SHAPE 47X51 STRL (DRAPES) ×2 IMPLANT
DRSG MEPITEL 4X7.2 (GAUZE/BANDAGES/DRESSINGS) ×2 IMPLANT
DRSG PAD ABDOMINAL 8X10 ST (GAUZE/BANDAGES/DRESSINGS) ×2 IMPLANT
ELECT REM PT RETURN 9FT ADLT (ELECTROSURGICAL) ×2
ELECTRODE REM PT RTRN 9FT ADLT (ELECTROSURGICAL) ×1 IMPLANT
GAUZE SPONGE 4X4 12PLY STRL (GAUZE/BANDAGES/DRESSINGS) ×2 IMPLANT
GLOVE BIO SURGEON STRL SZ8 (GLOVE) ×2 IMPLANT
GLOVE BIOGEL PI IND STRL 8 (GLOVE) ×2 IMPLANT
GLOVE BIOGEL PI INDICATOR 8 (GLOVE) ×2
GLOVE ECLIPSE 8.0 STRL XLNG CF (GLOVE) ×2 IMPLANT
GLOVE SURG SS PI 7.0 STRL IVOR (GLOVE) ×1 IMPLANT
GLOVE SURG SS PI 7.5 STRL IVOR (GLOVE) ×1 IMPLANT
GOWN STRL REUS W/ TWL LRG LVL3 (GOWN DISPOSABLE) ×1 IMPLANT
GOWN STRL REUS W/ TWL XL LVL3 (GOWN DISPOSABLE) ×2 IMPLANT
GOWN STRL REUS W/TWL LRG LVL3 (GOWN DISPOSABLE) ×2
GOWN STRL REUS W/TWL XL LVL3 (GOWN DISPOSABLE) ×4
K-WIRE DBL END .054 LG (WIRE) ×1 IMPLANT
NDL HYPO 25X1 1.5 SAFETY (NEEDLE) IMPLANT
NEEDLE HYPO 22GX1.5 SAFETY (NEEDLE) IMPLANT
NEEDLE HYPO 25X1 1.5 SAFETY (NEEDLE) IMPLANT
NS IRRIG 1000ML POUR BTL (IV SOLUTION) ×2 IMPLANT
PACK BASIN DAY SURGERY FS (CUSTOM PROCEDURE TRAY) ×2 IMPLANT
PAD CAST 3X4 CTTN HI CHSV (CAST SUPPLIES) IMPLANT
PAD CAST 4YDX4 CTTN HI CHSV (CAST SUPPLIES) ×1 IMPLANT
PADDING CAST ABS 4INX4YD NS (CAST SUPPLIES)
PADDING CAST ABS COTTON 4X4 ST (CAST SUPPLIES) IMPLANT
PADDING CAST COTTON 3X4 STRL (CAST SUPPLIES) ×2
PADDING CAST COTTON 4X4 STRL (CAST SUPPLIES)
PADDING CAST COTTON 6X4 STRL (CAST SUPPLIES) IMPLANT
PENCIL SMOKE EVACUATOR (MISCELLANEOUS) ×2 IMPLANT
SANITIZER HAND PURELL 535ML FO (MISCELLANEOUS) ×2 IMPLANT
SHEET MEDIUM DRAPE 40X70 STRL (DRAPES) ×2 IMPLANT
SLEEVE SCD COMPRESS KNEE MED (MISCELLANEOUS) ×2 IMPLANT
SPLINT FAST PLASTER 5X30 (CAST SUPPLIES)
SPLINT PLASTER CAST FAST 5X30 (CAST SUPPLIES) IMPLANT
SPONGE LAP 18X18 RF (DISPOSABLE) ×2 IMPLANT
STOCKINETTE 6  STRL (DRAPES) ×2
STOCKINETTE 6 STRL (DRAPES) ×1 IMPLANT
SUCTION FRAZIER HANDLE 10FR (MISCELLANEOUS)
SUCTION TUBE FRAZIER 10FR DISP (MISCELLANEOUS) ×1 IMPLANT
SUT ETHILON 3 0 PS 1 (SUTURE) ×2 IMPLANT
SUT MNCRL AB 3-0 PS2 18 (SUTURE) ×2 IMPLANT
SUT VIC AB 0 SH 27 (SUTURE) IMPLANT
SUT VIC AB 2-0 SH 27 (SUTURE) ×2
SUT VIC AB 2-0 SH 27XBRD (SUTURE) IMPLANT
SUT VICRYL 0 UR6 27IN ABS (SUTURE) IMPLANT
SYR BULB EAR ULCER 3OZ GRN STR (SYRINGE) ×2 IMPLANT
SYR CONTROL 10ML LL (SYRINGE) IMPLANT
TOWEL GREEN STERILE FF (TOWEL DISPOSABLE) ×4 IMPLANT
TUBE CONNECTING 20X1/4 (TUBING) IMPLANT
UNDERPAD 30X36 HEAVY ABSORB (UNDERPADS AND DIAPERS) ×2 IMPLANT
YANKAUER SUCT BULB TIP NO VENT (SUCTIONS) IMPLANT

## 2020-07-20 NOTE — Progress Notes (Signed)
D: BP lower 85/55, 78/62,83/55  HR 71 asymptomatic. Notified Dr. Hyacinth Meeker, no new orders. Later Dr. Hyacinth Meeker VS. Pt said her usual BP was low. Will monitor

## 2020-07-20 NOTE — H&P (Signed)
Monica Perez is an 54 y.o. female.   Chief Complaint: right foot pain HPI: The patient is a 54 year old female without significant past medical history.  She has a long history of right forefoot pain at the lateral fifth metatarsal head.  She has a prominent tailor's bunion and has failed nonoperative treatment to date.  She presents today for surgical treatment.  Past Medical History:  Diagnosis Date  . Anxiety   . DVT of lower extremity (deep venous thrombosis) (HCC)   . Hyperlipemia   . PONV (postoperative nausea and vomiting)     Past Surgical History:  Procedure Laterality Date  . APPENDECTOMY    . ORIF FOREARM FRACTURE      History reviewed. No pertinent family history. Social History:  reports that she has never smoked. She has never used smokeless tobacco. She reports current alcohol use. She reports that she does not use drugs.  Allergies:  Allergies  Allergen Reactions  . Aspirin Other (See Comments)    bleeding  . Atovaquone-Proguanil Hcl Nausea Only  . Other Hives    Surgical glue  . Penicillins Hives and Rash       . Erythromycin Base Hives and Rash       . Morphine And Related Hives and Rash  . Sulfa Antibiotics Hives and Rash       . Sulfamethoxazole-Trimethoprim Hives and Rash         Medications Prior to Admission  Medication Sig Dispense Refill  . desvenlafaxine (PRISTIQ) 100 MG 24 hr tablet Take 100 mg by mouth daily.    Marland Kitchen lovastatin (MEVACOR) 20 MG tablet Take 1 tablet by mouth daily.    . Multiple Vitamin (MULTI-VITAMIN DAILY PO) Take 1 tablet by mouth daily.    . rivaroxaban (XARELTO) 10 MG TABS tablet Take 1 tablet (10 mg total) by mouth daily. 30 tablet 6  . VITAMIN D, CHOLECALCIFEROL, PO Take 4,000 Units by mouth daily.      No results found for this or any previous visit (from the past 48 hour(s)). No results found.  Review of Systems no recent fever, chills, nausea, vomiting or changes in her appetite  Blood pressure 120/77,  pulse 77, temperature 98.7 F (37.1 C), temperature source Oral, resp. rate 11, height 5\' 6"  (1.676 m), weight 104.5 kg, last menstrual period 09/03/2011, SpO2 99 %. Physical Exam  Well-nourished well-developed woman in no apparent distress.  Alert and oriented x4.  Normal mood and affect.  Normal gait.  The right forefoot has a prominent tailor's bunion.  There is callus over the lateral metatarsal head.  Skin is otherwise healthy and intact.  Pulses are palpable in the foot.  No lymphadenopathy.  5 out of 5 strength in plantarflexion and dorsiflexion of the ankle and toes.   Assessment/Plan Painful right foot tailor's bunion -to the operating room today for excision of the tailor's bunion.  The risks and benefits of the alternative treatment options have been discussed in detail.  The patient wishes to proceed with surgery and specifically understands risks of bleeding, infection, nerve damage, blood clots, need for additional surgery, amputation and death.   11/01/2011, MD 07-27-2020, 8:29 AM

## 2020-07-20 NOTE — Progress Notes (Signed)
Assisted Dr. Miller with right, ultrasound guided, popliteal block. Side rails up, monitors on throughout procedure. See vital signs in flow sheet. Tolerated Procedure well. 

## 2020-07-20 NOTE — Op Note (Signed)
07/20/2020  9:34 AM  PATIENT:  Monica Perez  54 y.o. female  PRE-OPERATIVE DIAGNOSIS:  Tailor's bunion right foot  POST-OPERATIVE DIAGNOSIS:  Tailor's bunion right foot  Procedure(s): 1.  Excision right tailor's bunion 2.  Right foot AP and oblique xrays  SURGEON:  Wylene Simmer, MD  ASSISTANT: Mechele Claude, PA-C  ANESTHESIA:   MAC, regional  EBL:  minimal   TOURNIQUET:   Total Tourniquet Time Documented: Calf (Right) - 12 minutes Total: Calf (Right) - 12 minutes  COMPLICATIONS:  None apparent  DISPOSITION:  Extubated, awake and stable to recovery.  INDICATION FOR PROCEDURE: The patient is a 54 year old female without significant past medical history.  She has a painful right foot bunionette or tailor's bunion.  She has failed nonoperative treatment to date including activity modification, oral anti-inflammatories and shoewear modification.  She presents now for surgical excision of this painful forefoot deformity.  PROCEDURE IN DETAIL:    After pre operative consent was obtained, and the correct operative site was identified, the patient was brought to the operating room and placed supine on the OR table.  Anesthesia was administered.  Pre-operative antibiotics were administered.  A surgical timeout was taken.  The right lower extremity was prepped and draped in standard sterile fashion.  The foot was exsanguinated and a 6 inch Esmarch tourniquet wrapped around the ankle.  A longitudinal incision was made over the dorsal lateral aspect of the fifth metatarsal head.  Dissection was carried down through the subcutaneous tissues.  Care was taken to protect superficial nerve branches.  The joint capsule was incised and elevated proximally and distally.  The hypertrophic lateral eminence was identified.  The oscillating saw was used to resect the lateral eminence in line with the metatarsal shaft.  The cut was beveled plantarward to take away pressure from the plantar lateral aspect  of the metatarsal head as well.  AP and oblique radiographs confirmed appropriate resection of bone and excision of the tailor's bunion.  The wound was then irrigated copiously.  Vancomycin powder were sprinkled in the wound.  The lateral joint capsule was repaired with imbricating sutures of 2-0 Vicryl.  The skin incision was closed with horizontal mattress sutures of 3-0 nylon.  Sterile dressings were applied followed by a compression wrap.  The tourniquet was released after application of the dressings.  The patient was awakened from anesthesia and transported to the recovery room in stable condition.   FOLLOW UP PLAN: Weightbearing as tolerated in a flat postop shoe.  Follow-up in the office in 2 weeks for suture removal.  Resume Xarelto postop day 1.   RADIOGRAPHS: AP and oblique radiographs of the right foot are obtained intraoperatively.  These show complete excision of the tailor's bunion from the fifth metatarsal head.  No degenerative changes or acute injuries are noted.    Mechele Claude PA-C was present and scrubbed for the duration of the operative case. His assistance was essential in positioning the patient, prepping and draping, gaining and maintaining exposure, performing the operation, closing and dressing the wounds and applying the splint.

## 2020-07-20 NOTE — Anesthesia Preprocedure Evaluation (Signed)
Anesthesia Evaluation  Patient identified by MRN, date of birth, ID band Patient awake    Reviewed: Allergy & Precautions, NPO status , Patient's Chart, lab work & pertinent test results  History of Anesthesia Complications (+) PONV  Airway Mallampati: II  TM Distance: >3 FB Neck ROM: Full    Dental no notable dental hx.    Pulmonary neg pulmonary ROS,    Pulmonary exam normal breath sounds clear to auscultation       Cardiovascular negative cardio ROS Normal cardiovascular exam Rhythm:Regular Rate:Normal     Neuro/Psych Anxiety negative neurological ROS  negative psych ROS   GI/Hepatic negative GI ROS, Neg liver ROS,   Endo/Other  negative endocrine ROS  Renal/GU negative Renal ROS  negative genitourinary   Musculoskeletal negative musculoskeletal ROS (+)   Abdominal (+) + obese,   Peds negative pediatric ROS (+)  Hematology negative hematology ROS (+)   Anesthesia Other Findings   Reproductive/Obstetrics negative OB ROS                             Anesthesia Physical Anesthesia Plan  ASA: II  Anesthesia Plan: MAC and Regional   Post-op Pain Management:    Induction: Intravenous  PONV Risk Score and Plan: 3 and Ondansetron, Dexamethasone, Midazolam and Treatment may vary due to age or medical condition  Airway Management Planned: Simple Face Mask  Additional Equipment:   Intra-op Plan:   Post-operative Plan:   Informed Consent: I have reviewed the patients History and Physical, chart, labs and discussed the procedure including the risks, benefits and alternatives for the proposed anesthesia with the patient or authorized representative who has indicated his/her understanding and acceptance.     Dental advisory given  Plan Discussed with: CRNA  Anesthesia Plan Comments:         Anesthesia Quick Evaluation

## 2020-07-20 NOTE — Anesthesia Procedure Notes (Signed)
Procedure Name: MAC Date/Time: 07/20/2020 9:05 AM Performed by: Signe Colt, CRNA Pre-anesthesia Checklist: Patient identified, Emergency Drugs available, Suction available, Patient being monitored and Timeout performed Patient Re-evaluated:Patient Re-evaluated prior to induction Oxygen Delivery Method: Simple face mask

## 2020-07-20 NOTE — Transfer of Care (Signed)
Immediate Anesthesia Transfer of Care Note  Patient: Monica Perez  Procedure(s) Performed: Excision right bunionette (Right Toe)  Patient Location: PACU  Anesthesia Type:MAC combined with regional for post-op pain  Level of Consciousness: awake, alert , oriented and patient cooperative  Airway & Oxygen Therapy: Patient Spontanous Breathing and Patient connected to face mask oxygen  Post-op Assessment: Report given to RN and Post -op Vital signs reviewed and stable  Post vital signs: Reviewed and stable  Last Vitals:  Vitals Value Taken Time  BP    Temp    Pulse 72 07/20/20 0923  Resp 15 07/20/20 0923  SpO2 100 % 07/20/20 0923  Vitals shown include unvalidated device data.  Last Pain:  Vitals:   07/20/20 0708  TempSrc: Oral  PainSc: 0-No pain      Patients Stated Pain Goal: 6 (37/44/51 4604)  Complications: No complications documented.

## 2020-07-20 NOTE — Anesthesia Postprocedure Evaluation (Signed)
Anesthesia Post Note  Patient: Monica Perez  Procedure(s) Performed: Excision right bunionette (Right Toe)     Patient location during evaluation: PACU Anesthesia Type: Regional Level of consciousness: awake and alert Pain management: pain level controlled Vital Signs Assessment: post-procedure vital signs reviewed and stable Respiratory status: spontaneous breathing, nonlabored ventilation and respiratory function stable Cardiovascular status: blood pressure returned to baseline and stable Postop Assessment: no apparent nausea or vomiting Anesthetic complications: no   No complications documented.  Last Vitals:  Vitals:   07/20/20 0945 07/20/20 1000  BP: 97/70 93/62  Pulse: 72 70  Resp: 14 13  Temp:  (!) 36.3 C  SpO2: 100% 100%    Last Pain:  Vitals:   07/20/20 1000  TempSrc:   PainSc: 0-No pain    LLE Motor Response: No movement due to regional block (07/20/20 1021) LLE Sensation: Numbness (07/20/20 1021)          Lowella Curb

## 2020-07-20 NOTE — Discharge Instructions (Addendum)
Toni Arthurs, MD [facility]  Please read the following information regarding your care after surgery.  Medications  You only need a prescription for the narcotic pain medicine (ex. oxycodone, Percocet, Norco).  All of the other medicines listed below are available over the counter. X Aleve 2 pills twice a day for the first 3 days after surgery. X acetominophen (Tylenol) 650 mg every 4-6 hours as you need for minor to moderate pain X oxycodone as prescribed for severe pain X resume your Xarelto tomorrow 07/21/20.  Narcotic pain medicine (ex. oxycodone, Percocet, Vicodin) will cause constipation.  To prevent this problem, take the following medicines while you are taking any pain medicine. X docusate sodium (Colace) 100 mg twice a day X senna (Senokot) 2 tablets twice a day  Weight Bearing X Bear weight only on your operated foot in the post-op shoe.   Cast / Splint / Dressing X Keep your splint, cast or dressing clean and dry.  Don't put anything (coat hanger, pencil, etc) down inside of it.  If it gets damp, use a hair dryer on the cool setting to dry it.  If it gets soaked, call the office to schedule an appointment for a cast change.   After your dressing, cast or splint is removed; you may shower, but do not soak or scrub the wound.  Allow the water to run over it, and then gently pat it dry.  Swelling It is normal for you to have swelling where you had surgery.  To reduce swelling and pain, keep your toes above your nose for at least 3 days after surgery.  It may be necessary to keep your foot or leg elevated for several weeks.  If it hurts, it should be elevated.  Follow Up Call my office at [phone] when you are discharged from the hospital or surgery center to schedule an appointment to be seen two weeks after surgery.  Call my office at [phone] if you develop a fever >101.5 F, nausea, vomiting, bleeding from the surgical site or severe pain.    Regional Anesthesia  Blocks  1. Numbness or the inability to move the "blocked" extremity may last from 3-48 hours after placement. The length of time depends on the medication injected and your individual response to the medication. If the numbness is not going away after 48 hours, call your surgeon.  2. The extremity that is blocked will need to be protected until the numbness is gone and the  Strength has returned. Because you cannot feel it, you will need to take extra care to avoid injury. Because it may be weak, you may have difficulty moving it or using it. You may not know what position it is in without looking at it while the block is in effect.  3. For blocks in the legs and feet, returning to weight bearing and walking needs to be done carefully. You will need to wait until the numbness is entirely gone and the strength has returned. You should be able to move your leg and foot normally before you try and bear weight or walk. You will need someone to be with you when you first try to ensure you do not fall and possibly risk injury.  4. Bruising and tenderness at the needle site are common side effects and will resolve in a few days.  5. Persistent numbness or new problems with movement should be communicated to the surgeon or the [facility] [phone] [facility] [phone]).

## 2020-07-20 NOTE — Anesthesia Procedure Notes (Signed)
Anesthesia Regional Block: Popliteal block   Pre-Anesthetic Checklist: ,, timeout performed, Correct Patient, Correct Site, Correct Laterality, Correct Procedure, Correct Position, site marked, Risks and benefits discussed,  Surgical consent,  Pre-op evaluation,  At surgeon's request and post-op pain management  Laterality: Right  Prep: chloraprep       Needles:  Injection technique: Single-shot  Needle Type: Stimiplex     Needle Length: 9cm  Needle Gauge: 21     Additional Needles:   Procedures:,,,, ultrasound used (permanent image in chart),,,,  Narrative:  Start time: 07/20/2020 7:52 AM End time: 07/20/2020 7:57 AM Injection made incrementally with aspirations every 5 mL.  Performed by: Personally  Anesthesiologist: Lowella Curb, MD

## 2020-07-21 ENCOUNTER — Encounter (HOSPITAL_BASED_OUTPATIENT_CLINIC_OR_DEPARTMENT_OTHER): Payer: Self-pay | Admitting: Orthopedic Surgery

## 2020-07-21 ENCOUNTER — Encounter: Payer: Self-pay | Admitting: Family

## 2020-08-04 ENCOUNTER — Other Ambulatory Visit: Payer: Self-pay

## 2020-08-04 DIAGNOSIS — M79606 Pain in leg, unspecified: Secondary | ICD-10-CM

## 2020-08-04 DIAGNOSIS — M7989 Other specified soft tissue disorders: Secondary | ICD-10-CM

## 2020-08-11 ENCOUNTER — Other Ambulatory Visit: Payer: Self-pay

## 2020-08-11 ENCOUNTER — Ambulatory Visit (INDEPENDENT_AMBULATORY_CARE_PROVIDER_SITE_OTHER): Payer: BC Managed Care – PPO | Admitting: Vascular Surgery

## 2020-08-11 ENCOUNTER — Ambulatory Visit (HOSPITAL_COMMUNITY)
Admission: RE | Admit: 2020-08-11 | Discharge: 2020-08-11 | Disposition: A | Discharge status: Home / Self Care | Payer: BC Managed Care – PPO | Source: Ambulatory Visit | Attending: Vascular Surgery | Admitting: Vascular Surgery

## 2020-08-11 ENCOUNTER — Encounter: Payer: Self-pay | Admitting: Vascular Surgery

## 2020-08-11 VITALS — BP 119/74 | HR 94 | Temp 98.3°F | Resp 20 | Ht 66.0 in | Wt 227.0 lb

## 2020-08-11 DIAGNOSIS — M7989 Other specified soft tissue disorders: Secondary | ICD-10-CM

## 2020-08-11 DIAGNOSIS — M79605 Pain in left leg: Secondary | ICD-10-CM

## 2020-08-11 DIAGNOSIS — M79606 Pain in leg, unspecified: Secondary | ICD-10-CM | POA: Insufficient documentation

## 2020-08-11 NOTE — Progress Notes (Signed)
Patient ID: Monica Perez, female   DOB: Apr 19, 1966, 54 y.o.   MRN: [id number]  Reason for Consult: No chief complaint on file.   Referred by Verdie Mosher, NP  Subjective:     HPI:  Monica Perez is a 54 y.o. female has a history of extensive left lower extremity DVT 3 years ago.  This has been treated with anticoagulation since that time.  She has had persistent swelling particularly with any activity.  She does wear compression stockings religiously but she does have significant pain particularly below the knee and involving the ankle.  She has had evaluation at other facilities has never had any venous intervention.  The clot was only in the left lower extremity.  They never found any hypercoagulable states and said she has remained on anticoagulation for this.  She follows up today with reflux study.  Past Medical History:  Diagnosis Date  . Anxiety   . DVT of lower extremity (deep venous thrombosis) (HCC)   . Hyperlipemia   . PONV (postoperative nausea and vomiting)    No family history on file. Past Surgical History:  Procedure Laterality Date  . APPENDECTOMY    . BUNIONECTOMY Right 07/20/2020   Procedure: Excision right bunionette;  Surgeon: Toni Arthurs, MD;  Location: [facility];  Service: Orthopedics;  Laterality: Right;  Fifth toe  . ORIF FOREARM FRACTURE      Short Social History:  Social History   Tobacco Use  . Smoking status: Never Smoker  . Smokeless tobacco: Never Used  Substance Use Topics  . Alcohol use: Yes    Comment: once a month    Allergies  Allergen Reactions  . Aspirin Other (See Comments)    bleeding  . Atovaquone-Proguanil Hcl Nausea Only  . Other Hives    Surgical glue  . Penicillins Hives and Rash       . Erythromycin Base Hives and Rash       . Morphine And Related Hives and Rash  . Sulfa Antibiotics Hives and Rash       . Sulfamethoxazole-Trimethoprim Hives and Rash         Current Outpatient  Medications  Medication Sig Dispense Refill  . desvenlafaxine (PRISTIQ) 100 MG 24 hr tablet Take 100 mg by mouth daily.    Marland Kitchen docusate sodium (COLACE) 100 MG capsule Take 1 capsule (100 mg total) by mouth 2 (two) times daily. While taking narcotic pain medicine. 30 capsule 0  . lovastatin (MEVACOR) 20 MG tablet Take 1 tablet by mouth daily.    . Multiple Vitamin (MULTI-VITAMIN DAILY PO) Take 1 tablet by mouth daily.    . rivaroxaban (XARELTO) 10 MG TABS tablet Take 1 tablet (10 mg total) by mouth daily. 30 tablet 6  . senna (SENOKOT) 8.6 MG TABS tablet Take 2 tablets (17.2 mg total) by mouth 2 (two) times daily. 30 tablet 0  . VITAMIN D, CHOLECALCIFEROL, PO Take 4,000 Units by mouth daily.     No current facility-administered medications for this visit.    Review of Systems  Constitutional:  Constitutional negative. HENT: HENT negative.  Eyes: Eyes negative.  Cardiovascular: Positive for leg swelling.  GI: Gastrointestinal negative.  Musculoskeletal: Positive for leg pain.  Skin: Skin negative.  Neurological: Neurological negative. Hematologic: Hematologic/lymphatic negative.  Psychiatric: Psychiatric negative.        Objective:  Objective  Vitals:   08/11/20 1344  BP: 119/74  Pulse: 94  Resp: 20  Temp: 98.3 F (  36.8 C)  SpO2: 96%    Physical Exam HENT:     Head: Normocephalic.     Nose:     Comments: Wearing a mask Cardiovascular:     Pulses: Normal pulses.  Pulmonary:     Effort: Pulmonary effort is normal.  Abdominal:     General: Abdomen is flat.     Palpations: Abdomen is soft.  Musculoskeletal:     Cervical back: Neck supple.     Right lower leg: No edema.     Left lower leg: No edema.  Skin:    General: Skin is warm.     Capillary Refill: Capillary refill takes less than 2 seconds.  Neurological:     General: No focal deficit present.     Mental Status: She is alert.  Psychiatric:        Mood and Affect: Mood normal.        Behavior: Behavior  normal.        Thought Content: Thought content normal.        Judgment: Judgment normal.     Data: +---------------------+---------+------+-----------+------------+--------+  LEFT         Reflux NoRefluxReflux TimeDiameter cmsComments                   Yes                   +---------------------+---------+------+-----------+------------+--------+  CFV               yes  >1 second             +---------------------+---------+------+-----------+------------+--------+  FV mid        no                         +---------------------+---------+------+-----------+------------+--------+  Popliteal      no                         +---------------------+---------+------+-----------+------------+--------+  GSV at SFJ            yes  >500 ms   1.02        +---------------------+---------+------+-----------+------------+--------+  GSV prox thigh          yes  >500 ms   0.71        +---------------------+---------+------+-----------+------------+--------+  GSV mid thigh    no               0.41        +---------------------+---------+------+-----------+------------+--------+  GSV dist thigh    no               0.47        +---------------------+---------+------+-----------+------------+--------+  GSV at knee     no               0.53        +---------------------+---------+------+-----------+------------+--------+  GSV prox calf    no               0.36        +---------------------+---------+------+-----------+------------+--------+  GSV dist calf    no                          +---------------------+---------+------+-----------+------------+--------+  SSV Pop Fossa    no               0.36        +---------------------+---------+------+-----------+------------+--------+  SSV prox calf    no  0.28        +---------------------+---------+------+-----------+------------+--------+  SSV mid calf     no               0.28        +---------------------+---------+------+-----------+------------+--------+  Perforator (mid calf)      yes  >500 ms             +---------------------+---------+------+-----------+------------+--------+         Summary:    Left:  - No evidence of deep vein thrombosis seen in the left lower extremity,  from the common femoral through the popliteal veins.  - No evidence of superficial venous thrombosis in the left lower  extremity.    - Venous reflux is noted in the left common femoral vein.  - Venous reflux is noted in the left sapheno-femoral junction.  - Venous reflux is noted in the left greater saphenous vein in the thigh.  - Venous reflux is noted in the left perforator vein (mid calf).        Assessment/Plan:     54 year old female with the above-noted pain and swelling of her left lower extremity after extensive left lower extremity DVT.  She does have deep venous reflux she has some greater saphenous vein reflux with also appears to have some chronic appearing thrombus.  I do not think this accounts for significant amount of pain and swelling.  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  Our options would be continued anticoagulation indefinitely versus pursuing diagnosis of May Thurner.  We could do this with duplex however I do not think we would get an adequate study.  We could  also perform CT venogram or angiography.  She has decided for venography from a prone position left lower extremity approach with intravascular ultrasound and possible intervention.  I discussed with her that if she does have stenting she may require overnight stay for pain control.  She demonstrates good understanding.  She can take her anticoagulation up until the night before.     Maeola Harman MD [facility]

## 2020-08-11 NOTE — H&P (View-Only) (Signed)
Patient ID: Monica Perez, female   DOB: Apr 19, 1966, 54 y.o.   MRN: [id number]  Reason for Consult: No chief complaint on file.   Referred by Verdie Mosher, NP  Subjective:     HPI:  Monica Perez is a 54 y.o. female has a history of extensive left lower extremity DVT 3 years ago.  This has been treated with anticoagulation since that time.  She has had persistent swelling particularly with any activity.  She does wear compression stockings religiously but she does have significant pain particularly below the knee and involving the ankle.  She has had evaluation at other facilities has never had any venous intervention.  The clot was only in the left lower extremity.  They never found any hypercoagulable states and said she has remained on anticoagulation for this.  She follows up today with reflux study.  Past Medical History:  Diagnosis Date  . Anxiety   . DVT of lower extremity (deep venous thrombosis) (HCC)   . Hyperlipemia   . PONV (postoperative nausea and vomiting)    No family history on file. Past Surgical History:  Procedure Laterality Date  . APPENDECTOMY    . BUNIONECTOMY Right 07/20/2020   Procedure: Excision right bunionette;  Surgeon: Toni Arthurs, MD;  Location: [facility];  Service: Orthopedics;  Laterality: Right;  Fifth toe  . ORIF FOREARM FRACTURE      Short Social History:  Social History   Tobacco Use  . Smoking status: Never Smoker  . Smokeless tobacco: Never Used  Substance Use Topics  . Alcohol use: Yes    Comment: once a month    Allergies  Allergen Reactions  . Aspirin Other (See Comments)    bleeding  . Atovaquone-Proguanil Hcl Nausea Only  . Other Hives    Surgical glue  . Penicillins Hives and Rash       . Erythromycin Base Hives and Rash       . Morphine And Related Hives and Rash  . Sulfa Antibiotics Hives and Rash       . Sulfamethoxazole-Trimethoprim Hives and Rash         Current Outpatient  Medications  Medication Sig Dispense Refill  . desvenlafaxine (PRISTIQ) 100 MG 24 hr tablet Take 100 mg by mouth daily.    Marland Kitchen docusate sodium (COLACE) 100 MG capsule Take 1 capsule (100 mg total) by mouth 2 (two) times daily. While taking narcotic pain medicine. 30 capsule 0  . lovastatin (MEVACOR) 20 MG tablet Take 1 tablet by mouth daily.    . Multiple Vitamin (MULTI-VITAMIN DAILY PO) Take 1 tablet by mouth daily.    . rivaroxaban (XARELTO) 10 MG TABS tablet Take 1 tablet (10 mg total) by mouth daily. 30 tablet 6  . senna (SENOKOT) 8.6 MG TABS tablet Take 2 tablets (17.2 mg total) by mouth 2 (two) times daily. 30 tablet 0  . VITAMIN D, CHOLECALCIFEROL, PO Take 4,000 Units by mouth daily.     No current facility-administered medications for this visit.    Review of Systems  Constitutional:  Constitutional negative. HENT: HENT negative.  Eyes: Eyes negative.  Cardiovascular: Positive for leg swelling.  GI: Gastrointestinal negative.  Musculoskeletal: Positive for leg pain.  Skin: Skin negative.  Neurological: Neurological negative. Hematologic: Hematologic/lymphatic negative.  Psychiatric: Psychiatric negative.        Objective:  Objective  Vitals:   08/11/20 1344  BP: 119/74  Pulse: 94  Resp: 20  Temp: 98.3 F (  36.8 C)  SpO2: 96%    Physical Exam HENT:     Head: Normocephalic.     Nose:     Comments: Wearing a mask Cardiovascular:     Pulses: Normal pulses.  Pulmonary:     Effort: Pulmonary effort is normal.  Abdominal:     General: Abdomen is flat.     Palpations: Abdomen is soft.  Musculoskeletal:     Cervical back: Neck supple.     Right lower leg: No edema.     Left lower leg: No edema.  Skin:    General: Skin is warm.     Capillary Refill: Capillary refill takes less than 2 seconds.  Neurological:     General: No focal deficit present.     Mental Status: She is alert.  Psychiatric:        Mood and Affect: Mood normal.        Behavior: Behavior  normal.        Thought Content: Thought content normal.        Judgment: Judgment normal.     Data: +---------------------+---------+------+-----------+------------+--------+  LEFT         Reflux NoRefluxReflux TimeDiameter cmsComments                   Yes                   +---------------------+---------+------+-----------+------------+--------+  CFV               yes  >1 second             +---------------------+---------+------+-----------+------------+--------+  FV mid        no                         +---------------------+---------+------+-----------+------------+--------+  Popliteal      no                         +---------------------+---------+------+-----------+------------+--------+  GSV at SFJ            yes  >500 ms   1.02        +---------------------+---------+------+-----------+------------+--------+  GSV prox thigh          yes  >500 ms   0.71        +---------------------+---------+------+-----------+------------+--------+  GSV mid thigh    no               0.41        +---------------------+---------+------+-----------+------------+--------+  GSV dist thigh    no               0.47        +---------------------+---------+------+-----------+------------+--------+  GSV at knee     no               0.53        +---------------------+---------+------+-----------+------------+--------+  GSV prox calf    no               0.36        +---------------------+---------+------+-----------+------------+--------+  GSV dist calf    no                          +---------------------+---------+------+-----------+------------+--------+  SSV Pop Fossa    no               0.36        +---------------------+---------+------+-----------+------------+--------+  SSV prox calf    no  0.28        +---------------------+---------+------+-----------+------------+--------+  SSV mid calf     no               0.28        +---------------------+---------+------+-----------+------------+--------+  Perforator (mid calf)      yes  >500 ms             +---------------------+---------+------+-----------+------------+--------+         Summary:    Left:  - No evidence of deep vein thrombosis seen in the left lower extremity,  from the common femoral through the popliteal veins.  - No evidence of superficial venous thrombosis in the left lower  extremity.    - Venous reflux is noted in the left common femoral vein.  - Venous reflux is noted in the left sapheno-femoral junction.  - Venous reflux is noted in the left greater saphenous vein in the thigh.  - Venous reflux is noted in the left perforator vein (mid calf).        Assessment/Plan:     54 year old female with the above-noted pain and swelling of her left lower extremity after extensive left lower extremity DVT.  She does have deep venous reflux she has some greater saphenous vein reflux with also appears to have some chronic appearing thrombus.  I do not think this accounts for significant amount of pain and swelling.  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  Our options would be continued anticoagulation indefinitely versus pursuing diagnosis of May Thurner.  We could do this with duplex however I do not think we would get an adequate study.  We could  also perform CT venogram or angiography.  She has decided for venography from a prone position left lower extremity approach with intravascular ultrasound and possible intervention.  I discussed with her that if she does have stenting she may require overnight stay for pain control.  She demonstrates good understanding.  She can take her anticoagulation up until the night before.     Maeola Harman MD [facility]

## 2020-08-24 ENCOUNTER — Other Ambulatory Visit (HOSPITAL_COMMUNITY)
Admission: RE | Admit: 2020-08-24 | Discharge: 2020-08-24 | Disposition: A | Discharge status: Home / Self Care | Payer: BC Managed Care – PPO | Source: Ambulatory Visit | Attending: Vascular Surgery | Admitting: Vascular Surgery

## 2020-08-24 DIAGNOSIS — Z20822 Contact with and (suspected) exposure to covid-19: Secondary | ICD-10-CM | POA: Insufficient documentation

## 2020-08-24 DIAGNOSIS — Z01812 Encounter for preprocedural laboratory examination: Secondary | ICD-10-CM | POA: Diagnosis present

## 2020-08-24 LAB — SARS CORONAVIRUS 2 (TAT 6-24 HRS): SARS Coronavirus 2: NEGATIVE

## 2020-08-28 ENCOUNTER — Ambulatory Visit (HOSPITAL_COMMUNITY)
Admission: RE | Disposition: A | Discharge status: Home / Self Care | Payer: Self-pay | Source: Home / Self Care | Attending: Vascular Surgery

## 2020-08-28 ENCOUNTER — Other Ambulatory Visit: Payer: Self-pay

## 2020-08-28 ENCOUNTER — Observation Stay (HOSPITAL_COMMUNITY)
Admission: RE | Admit: 2020-08-28 | Discharge: 2020-08-29 | Disposition: A | Discharge status: Home / Self Care | Payer: BC Managed Care – PPO | Attending: Vascular Surgery | Admitting: Vascular Surgery

## 2020-08-28 ENCOUNTER — Encounter (HOSPITAL_COMMUNITY): Payer: Self-pay | Admitting: Vascular Surgery

## 2020-08-28 DIAGNOSIS — M7989 Other specified soft tissue disorders: Secondary | ICD-10-CM | POA: Diagnosis not present

## 2020-08-28 DIAGNOSIS — Z888 Allergy status to other drugs, medicaments and biological substances status: Secondary | ICD-10-CM | POA: Diagnosis not present

## 2020-08-28 DIAGNOSIS — Z86718 Personal history of other venous thrombosis and embolism: Secondary | ICD-10-CM | POA: Insufficient documentation

## 2020-08-28 DIAGNOSIS — Z882 Allergy status to sulfonamides status: Secondary | ICD-10-CM | POA: Diagnosis not present

## 2020-08-28 DIAGNOSIS — Z880 Allergy status to penicillin: Secondary | ICD-10-CM | POA: Insufficient documentation

## 2020-08-28 DIAGNOSIS — Z7901 Long term (current) use of anticoagulants: Secondary | ICD-10-CM | POA: Insufficient documentation

## 2020-08-28 DIAGNOSIS — Z886 Allergy status to analgesic agent status: Secondary | ICD-10-CM | POA: Insufficient documentation

## 2020-08-28 DIAGNOSIS — R2242 Localized swelling, mass and lump, left lower limb: Secondary | ICD-10-CM

## 2020-08-28 DIAGNOSIS — Z885 Allergy status to narcotic agent status: Secondary | ICD-10-CM | POA: Diagnosis not present

## 2020-08-28 DIAGNOSIS — Z881 Allergy status to other antibiotic agents status: Secondary | ICD-10-CM | POA: Diagnosis not present

## 2020-08-28 DIAGNOSIS — I871 Compression of vein: Principal | ICD-10-CM | POA: Diagnosis present

## 2020-08-28 HISTORY — PX: LOWER EXTREMITY VENOGRAPHY: CATH118253

## 2020-08-28 HISTORY — PX: PERIPHERAL VASCULAR INTERVENTION: CATH118257

## 2020-08-28 LAB — POCT I-STAT, CHEM 8
BUN: 29 mg/dL — ABNORMAL HIGH (ref 6–20)
Calcium, Ion: 1.27 mmol/L (ref 1.15–1.40)
Chloride: 103 mmol/L (ref 98–111)
Creatinine, Ser: 0.9 mg/dL (ref 0.44–1.00)
Glucose, Bld: 119 mg/dL — ABNORMAL HIGH (ref 70–99)
HCT: 43 % (ref 36.0–46.0)
Hemoglobin: 14.6 g/dL (ref 12.0–15.0)
Potassium: 4.5 mmol/L (ref 3.5–5.1)
Sodium: 138 mmol/L (ref 135–145)
TCO2: 28 mmol/L (ref 22–32)

## 2020-08-28 SURGERY — LOWER EXTREMITY VENOGRAPHY
Anesthesia: LOCAL | Laterality: Left

## 2020-08-28 MED ORDER — SODIUM CHLORIDE 0.9% FLUSH
3.0000 mL | Freq: Two times a day (BID) | INTRAVENOUS | Status: DC
  Administered 2020-08-28: 3 mL via INTRAVENOUS

## 2020-08-28 MED ORDER — MIDAZOLAM HCL 2 MG/2ML IJ SOLN
INTRAMUSCULAR | Status: DC | PRN
  Administered 2020-08-28: 2 mg via INTRAVENOUS
  Administered 2020-08-28: 1 mg via INTRAVENOUS

## 2020-08-28 MED ORDER — CHOLECALCIFEROL 50 MCG (2000 UT) PO TABS: 2000.0000 [IU] | ORAL_TABLET | Freq: Every day | ORAL | Status: DC

## 2020-08-28 MED ORDER — OXYCODONE HCL 5 MG PO TABS
5.0000 mg | ORAL_TABLET | ORAL | Status: DC | PRN
  Administered 2020-08-28: 10 mg via ORAL
  Filled 2020-08-28: qty 2

## 2020-08-28 MED ORDER — HYDRALAZINE HCL 20 MG/ML IJ SOLN: 5.0000 mg | INTRAMUSCULAR | Status: DC | PRN

## 2020-08-28 MED ORDER — LABETALOL HCL 5 MG/ML IV SOLN: 10.0000 mg | INTRAVENOUS | Status: DC | PRN

## 2020-08-28 MED ORDER — ONDANSETRON HCL 4 MG/2ML IJ SOLN: 4.0000 mg | Freq: Four times a day (QID) | INTRAMUSCULAR | Status: DC | PRN

## 2020-08-28 MED ORDER — LIDOCAINE HCL (PF) 1 % IJ SOLN
INTRAMUSCULAR | Status: AC
  Filled 2020-08-28: qty 30

## 2020-08-28 MED ORDER — VITAMIN D 25 MCG (1000 UNIT) PO TABS
2000.0000 [IU] | ORAL_TABLET | Freq: Every day | ORAL | Status: DC
  Administered 2020-08-28: 2000 [IU] via ORAL
  Filled 2020-08-28: qty 2

## 2020-08-28 MED ORDER — SODIUM CHLORIDE 0.9% FLUSH: 3.0000 mL | INTRAVENOUS | Status: DC | PRN

## 2020-08-28 MED ORDER — SODIUM CHLORIDE 0.9 % IV SOLN: INTRAVENOUS | Status: DC

## 2020-08-28 MED ORDER — HEPARIN SODIUM (PORCINE) 1000 UNIT/ML IJ SOLN
INTRAMUSCULAR | Status: DC | PRN
  Administered 2020-08-28: 8000 [IU] via INTRAVENOUS

## 2020-08-28 MED ORDER — SODIUM CHLORIDE 0.9 % IV SOLN: 250.0000 mL | INTRAVENOUS | Status: DC | PRN

## 2020-08-28 MED ORDER — VASCULERA PO TABS: 630.0000 mg | ORAL_TABLET | Freq: Every day | ORAL | Status: DC

## 2020-08-28 MED ORDER — HYDROMORPHONE HCL 1 MG/ML IJ SOLN
0.5000 mg | INTRAMUSCULAR | Status: DC | PRN
  Administered 2020-08-28 (×2): 1 mg via INTRAVENOUS
  Filled 2020-08-28 (×3): qty 1

## 2020-08-28 MED ORDER — ACETAMINOPHEN 325 MG PO TABS
650.0000 mg | ORAL_TABLET | ORAL | Status: DC | PRN
  Filled 2020-08-28: qty 2

## 2020-08-28 MED ORDER — FLUCONAZOLE 150 MG PO TABS
150.0000 mg | ORAL_TABLET | ORAL | Status: DC
  Administered 2020-08-28: 150 mg via ORAL
  Filled 2020-08-28: qty 1

## 2020-08-28 MED ORDER — MIDAZOLAM HCL 2 MG/2ML IJ SOLN
INTRAMUSCULAR | Status: AC
  Filled 2020-08-28: qty 2

## 2020-08-28 MED ORDER — RIVAROXABAN 10 MG PO TABS
10.0000 mg | ORAL_TABLET | Freq: Every day | ORAL | Status: DC
  Administered 2020-08-28: 10 mg via ORAL
  Filled 2020-08-28 (×2): qty 1

## 2020-08-28 MED ORDER — KETOROLAC TROMETHAMINE 30 MG/ML IJ SOLN
30.0000 mg | Freq: Four times a day (QID) | INTRAMUSCULAR | Status: DC
  Administered 2020-08-28 – 2020-08-29 (×3): 30 mg via INTRAVENOUS
  Filled 2020-08-28 (×5): qty 1

## 2020-08-28 MED ORDER — IODIXANOL 320 MG/ML IV SOLN
INTRAVENOUS | Status: DC | PRN
  Administered 2020-08-28: 30 mL via INTRAVENOUS

## 2020-08-28 MED ORDER — CLOTRIMAZOLE 10 MG MT TROC
10.0000 mg | OROMUCOSAL | Status: DC
  Administered 2020-08-28: 10 mg via ORAL
  Filled 2020-08-28: qty 1

## 2020-08-28 MED ORDER — PRAVASTATIN SODIUM 10 MG PO TABS
10.0000 mg | ORAL_TABLET | Freq: Every day | ORAL | Status: DC
  Administered 2020-08-28: 10 mg via ORAL
  Filled 2020-08-28: qty 1

## 2020-08-28 MED ORDER — CLOPIDOGREL BISULFATE 75 MG PO TABS
ORAL_TABLET | ORAL | Status: AC
  Filled 2020-08-28: qty 1

## 2020-08-28 MED ORDER — KETOROLAC TROMETHAMINE 30 MG/ML IJ SOLN
30.0000 mg | INTRAMUSCULAR | Status: DC
  Filled 2020-08-28: qty 1

## 2020-08-28 MED ORDER — HEPARIN (PORCINE) IN NACL 1000-0.9 UT/500ML-% IV SOLN
INTRAVENOUS | Status: AC
  Filled 2020-08-28: qty 1000

## 2020-08-28 MED ORDER — DESVENLAFAXINE SUCCINATE ER 100 MG PO TB24
100.0000 mg | ORAL_TABLET | Freq: Every day | ORAL | Status: DC
  Administered 2020-08-28: 100 mg via ORAL
  Filled 2020-08-28: qty 1

## 2020-08-28 MED ORDER — ADULT MULTIVITAMIN W/MINERALS CH
1.0000 | ORAL_TABLET | Freq: Every day | ORAL | Status: DC
  Administered 2020-08-28: 1 via ORAL
  Filled 2020-08-28: qty 1

## 2020-08-28 MED ORDER — CLOPIDOGREL BISULFATE 75 MG PO TABS: 75.0000 mg | ORAL_TABLET | Freq: Every day | ORAL | Status: DC

## 2020-08-28 MED ORDER — LIDOCAINE HCL (PF) 1 % IJ SOLN
INTRAMUSCULAR | Status: DC | PRN
  Administered 2020-08-28: 4 mL via INTRADERMAL

## 2020-08-28 MED ORDER — FENTANYL CITRATE (PF) 100 MCG/2ML IJ SOLN
INTRAMUSCULAR | Status: AC
  Filled 2020-08-28: qty 2

## 2020-08-28 MED ORDER — KETOROLAC TROMETHAMINE 30 MG/ML IJ SOLN
INTRAMUSCULAR | Status: DC | PRN
  Administered 2020-08-28: 30 mg via INTRAVENOUS

## 2020-08-28 MED ORDER — MULTI-VITAMIN DAILY PO TABS: ORAL_TABLET | Freq: Every day | ORAL | Status: DC

## 2020-08-28 MED ORDER — HEPARIN (PORCINE) IN NACL 1000-0.9 UT/500ML-% IV SOLN
INTRAVENOUS | Status: DC | PRN
  Administered 2020-08-28: 500 mL

## 2020-08-28 MED ORDER — FENTANYL CITRATE (PF) 100 MCG/2ML IJ SOLN
INTRAMUSCULAR | Status: DC | PRN
  Administered 2020-08-28 (×2): 50 ug via INTRAVENOUS

## 2020-08-28 SURGICAL SUPPLY — 13 items
BALLN ATHLETIS 12X60X75 (BALLOONS) ×2
BALLOON ATHLETIS 12X60X75 (BALLOONS) IMPLANT
CATH VISIONS PV .035 IVUS (CATHETERS) ×1 IMPLANT
GLIDEWIRE ADV .035X260CM (WIRE) ×1 IMPLANT
KIT ENCORE 40 (KITS) ×1 IMPLANT
KIT MICROPUNCTURE NIT STIFF (SHEATH) ×1 IMPLANT
KIT PV (KITS) ×2 IMPLANT
SHEATH AVANTI 11F 11CM (SHEATH) ×1 IMPLANT
SHEATH PINNACLE 8F 10CM (SHEATH) ×1 IMPLANT
SHEATH PROBE COVER 6X72 (BAG) ×1 IMPLANT
STENT WALLSTENTÂ 16X90X75 (Permanent Stent) ×1 IMPLANT
TRAY PV CATH (CUSTOM PROCEDURE TRAY) ×2 IMPLANT
WIRE TORQFLEX AUST .018X40CM (WIRE) ×1 IMPLANT

## 2020-08-28 NOTE — Progress Notes (Signed)
Patient refuses TED at this time.

## 2020-08-28 NOTE — Op Note (Signed)
    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 Randie Heinz, MD Procedure Performed: 1.  Ultrasound-guided cannulation left small saphenous vein 2.  Left lower extremity and central venography 3.  Intravascular ultrasound of left popliteal, left femoral, left common femoral, left external and common iliac veins and IVC 4.  Stent of left common and external iliac veins with 16 x 90 mm Wallstent 5.  Moderate sedation with fentanyl and Versed for 45 minutes  Indications: 54 year old female with history of extensive left lower extremity DVT.  She has been maintained on anticoagulation she does have swelling.  She is now indicated for left lower extremity venography with intravascular ultrasound and possible intervention.  Findings: There was sluggish flow in the left lower extremity from the popliteal through the femoral and common femoral up into the external iliac veins.  The common iliac vein were normal measured 12 mm.  At the confluence with the crossing of the right common iliac artery the vein measured 1.9 mm.  After stenting this measured 10 mm and was postdilated with a 12 mm balloon.  At completion there was much improvement in the sluggish rouleaux flow in the veins of the left lower extremity.   Procedure:  The patient was identified in the holding area and taken to room 8.  The patient was then placed prone on the table and prepped and draped in the usual sterile fashion.  A time out was called.  Ultrasound was used to evaluate the left small saphenous vein this was noted be patent and compressible the area was anesthetized 1% lidocaine.  We then cannulated with direct ultrasound visualization with micropuncture needle followed by wire sheath.  And images saved to the permanent record.  I placed a Glidewire advantage  followed by 8 [place] sheath.  Glidewire advantage was then placed into the right subclavian vein.  We performed intravascular ultrasound of the entire left lower extremity and IVC.  We demonstrated what appeared to be tight stenosis of the left common iliac vein at the crossing of the right common iliac artery with diameter of 1.9 mm.  With this we elected to intervene.  We performed venography centrally demonstrated no collateralization.  Wire was confirmed in the subclavian vein.  We marked our screen using IVUS.  We then primarily stented with a 16 x 90 mm stent.  This was postdilated with 12 mm balloon.  Patient did have significant pain with this.  Completion venography and IVUS demonstrated patency.  Diameter was 10 mm at completion where previously was 1.9 mm.  Satisfied with this we removed our wire.  Sheath was removed and a suture bolster was placed.  She tolerated procedure well without immediate complication.   Contrast: 30cc  Breah Joa C. Randie Heinz, MD [facility] [place] [facility] Office: [phone] Pager: [phone]

## 2020-08-28 NOTE — Progress Notes (Signed)
PHARMACIST - PHYSICIAN ORDER COMMUNICATION  CONCERNING: P&T Medication Policy on Herbal Medications  DESCRIPTION:  This patient's order for:  vasculera  has been noted.  This product(s) is classified as an "herbal" or natural product. Due to a lack of definitive safety studies or FDA approval, nonstandard manufacturing practices, plus the potential risk of unknown drug-drug interactions while on inpatient medications, the Pharmacy and Therapeutics Committee does not permit the use of "herbal" or natural products of this type within [facility].   ACTION TAKEN: The pharmacy department is unable to verify this order at this time and your patient has been informed of this safety policy. Please reevaluate patient's clinical condition at discharge and address if the herbal or natural product(s) should be resumed at that time.  Reece Leader, Colon Flattery, BCCP Clinical Pharmacist  08/28/2020 10:30 AM   [facility] pharmacy phone numbers are listed on [email or web address]

## 2020-08-28 NOTE — Interval H&P Note (Signed)
History and Physical Interval Note:  08/28/2020 7:22 AM  Monica Perez  has presented today for surgery, with the diagnosis of may turner syndrme.  The various methods of treatment have been discussed with the patient and family. After consideration of risks, benefits and other options for treatment, the patient has consented to  Procedure(s): LOWER EXTREMITY VENOGRAPHY (Left) as a surgical intervention.  The patient's history has been reviewed, patient examined, no change in status, stable for surgery.  I have reviewed the patient's chart and labs.  Questions were answered to the patient's satisfaction.     Lemar Livings

## 2020-08-29 ENCOUNTER — Encounter (HOSPITAL_COMMUNITY): Payer: Self-pay | Admitting: Vascular Surgery

## 2020-08-29 DIAGNOSIS — I871 Compression of vein: Secondary | ICD-10-CM | POA: Diagnosis not present

## 2020-08-29 LAB — CBC
HCT: 39.3 % (ref 36.0–46.0)
Hemoglobin: 12.5 g/dL (ref 12.0–15.0)
MCH: 30.6 pg (ref 26.0–34.0)
MCHC: 31.8 g/dL (ref 30.0–36.0)
MCV: 96.1 fL (ref 80.0–100.0)
Platelets: 265 10*3/uL (ref 150–400)
RBC: 4.09 MIL/uL (ref 3.87–5.11)
RDW: 14.1 % (ref 11.5–15.5)
WBC: 12.1 10*3/uL — ABNORMAL HIGH (ref 4.0–10.5)
nRBC: 0 % (ref 0.0–0.2)

## 2020-08-29 LAB — BASIC METABOLIC PANEL
Anion gap: 9 (ref 5–15)
BUN: 23 mg/dL — ABNORMAL HIGH (ref 6–20)
CO2: 26 mmol/L (ref 22–32)
Calcium: 9.3 mg/dL (ref 8.9–10.3)
Chloride: 103 mmol/L (ref 98–111)
Creatinine, Ser: 1.01 mg/dL — ABNORMAL HIGH (ref 0.44–1.00)
GFR, Estimated: >60 mL/min (ref >60)
Glucose, Bld: 121 mg/dL — ABNORMAL HIGH (ref 70–99)
Potassium: 4.5 mmol/L (ref 3.5–5.1)
Sodium: 138 mmol/L (ref 135–145)

## 2020-08-29 MED FILL — Heparin Sod (Porcine)-NaCl IV Soln 1000 Unit/500ML-0.9%: INTRAVENOUS | Qty: 500 | Status: AC

## 2020-08-29 NOTE — Plan of Care (Deleted)
Problem: Education: Goal: Knowledge of General Education information will improve Description: Including pain rating scale, medication(s)/side effects and non-pharmacologic comfort measures Outcome: Adequate for Discharge   Problem: Health Behavior/Discharge Planning: Goal: Ability to manage health-related needs will improve Outcome: Adequate for Discharge   Problem: Clinical Measurements: Goal: Ability to maintain clinical measurements within normal limits will improve Outcome: Adequate for Discharge Goal: Will remain free from infection Outcome: Adequate for Discharge Goal: Diagnostic test results will improve Outcome: Adequate for Discharge Goal: Respiratory complications will improve Outcome: Adequate for Discharge Goal: Cardiovascular complication will be avoided Outcome: Adequate for Discharge   Problem: Activity: Goal: Risk for activity intolerance will decrease Outcome: Adequate for Discharge   Problem: Nutrition: Goal: Adequate nutrition will be maintained Outcome: Adequate for Discharge   Problem: Coping: Goal: Level of anxiety will decrease Outcome: Adequate for Discharge   Problem: Elimination: Goal: Will not experience complications related to bowel motility Outcome: Adequate for Discharge Goal: Will not experience complications related to urinary retention Outcome: Adequate for Discharge   Problem: Pain Managment: Goal: General experience of comfort will improve Outcome: Adequate for Discharge   Problem: Safety: Goal: Ability to remain free from injury will improve Outcome: Adequate for Discharge   Problem: Skin Integrity: Goal: Risk for impaired skin integrity will decrease Outcome: Adequate for Discharge   Problem: Education: Goal: Understanding of CV disease, CV risk reduction, and recovery process will improve Outcome: Adequate for Discharge Goal: Individualized Educational Video(s) Outcome: Adequate for Discharge   Problem:  Activity: Goal: Ability to return to baseline activity level will improve Outcome: Adequate for Discharge   Problem: Cardiovascular: Goal: Ability to achieve and maintain adequate cardiovascular perfusion will improve Outcome: Adequate for Discharge Goal: Vascular access site(s) Level 0-1 will be maintained Outcome: Adequate for Discharge   Problem: Health Behavior/Discharge Planning: Goal: Ability to safely manage health-related needs after discharge will improve Outcome: Adequate for Discharge   Problem: Education: Goal: Knowledge of prescribed regimen will improve Outcome: Adequate for Discharge   Problem: Bowel/Gastric: Goal: Gastrointestinal status for postoperative course will improve Outcome: Adequate for Discharge   Problem: Clinical Measurements: Goal: Signs and symptoms of graft occlusion will improve Outcome: Adequate for Discharge   Problem: Skin Integrity: Goal: Demonstration of wound healing without infection will improve Outcome: Adequate for Discharge   Problem: Education: Goal: Knowledge of General Education information will improve Description: Including pain rating scale, medication(s)/side effects and non-pharmacologic comfort measures Outcome: Adequate for Discharge   Problem: Health Behavior/Discharge Planning: Goal: Ability to manage health-related needs will improve Outcome: Adequate for Discharge   Problem: Clinical Measurements: Goal: Ability to maintain clinical measurements within normal limits will improve Outcome: Adequate for Discharge Goal: Will remain free from infection Outcome: Adequate for Discharge Goal: Diagnostic test results will improve Outcome: Adequate for Discharge Goal: Respiratory complications will improve Outcome: Adequate for Discharge Goal: Cardiovascular complication will be avoided Outcome: Adequate for Discharge   Problem: Activity: Goal: Risk for activity intolerance will decrease Outcome: Adequate for  Discharge   Problem: Nutrition: Goal: Adequate nutrition will be maintained Outcome: Adequate for Discharge   Problem: Coping: Goal: Level of anxiety will decrease Outcome: Adequate for Discharge   Problem: Elimination: Goal: Will not experience complications related to bowel motility Outcome: Adequate for Discharge Goal: Will not experience complications related to urinary retention Outcome: Adequate for Discharge   Problem: Pain Managment: Goal: General experience of comfort will improve Outcome: Adequate for Discharge   Problem: Safety: Goal: Ability to remain free from injury will improve  Outcome: Adequate for Discharge   Problem: Skin Integrity: Goal: Risk for impaired skin integrity will decrease Outcome: Adequate for Discharge   Problem: Education: Goal: Understanding of CV disease, CV risk reduction, and recovery process will improve Outcome: Adequate for Discharge Goal: Individualized Educational Video(s) Outcome: Adequate for Discharge   Problem: Activity: Goal: Ability to return to baseline activity level will improve Outcome: Adequate for Discharge   Problem: Cardiovascular: Goal: Ability to achieve and maintain adequate cardiovascular perfusion will improve Outcome: Adequate for Discharge Goal: Vascular access site(s) Level 0-1 will be maintained Outcome: Adequate for Discharge   Problem: Health Behavior/Discharge Planning: Goal: Ability to safely manage health-related needs after discharge will improve Outcome: Adequate for Discharge   Problem: Education: Goal: Knowledge of prescribed regimen will improve Outcome: Adequate for Discharge   Problem: Bowel/Gastric: Goal: Gastrointestinal status for postoperative course will improve Outcome: Adequate for Discharge   Problem: Clinical Measurements: Goal: Signs and symptoms of graft occlusion will improve Outcome: Adequate for Discharge   Problem: Skin Integrity: Goal: Demonstration of wound  healing without infection will improve Outcome: Adequate for Discharge

## 2020-08-29 NOTE — Progress Notes (Signed)
  Progress Note    08/29/2020 10:41 AM 1 Day Post-Op  Subjective: Doing well with minimal lower back discomfort  Vitals:   08/28/20 2211 08/28/20 2215  BP:  106/61  Pulse:    Resp:    Temp: 98.4 F (36.9 C)   SpO2:      Physical Exam: Awake alert oriented On the respirations Left leg without swelling this morning Left popliteal fossae bolster removed without significant hematoma  CBC    Component Value Date/Time   WBC 12.1 (H) 08/29/2020 0137   RBC 4.09 08/29/2020 0137   HGB 12.5 08/29/2020 0137   HGB 13.1 06/16/2020 0919   HCT 39.3 08/29/2020 0137   PLT 265 08/29/2020 0137   PLT 243 06/16/2020 0919   MCV 96.1 08/29/2020 0137   MCH 30.6 08/29/2020 0137   MCHC 31.8 08/29/2020 0137   RDW 14.1 08/29/2020 0137   LYMPHSABS 2.3 06/16/2020 0919   MONOABS 0.7 06/16/2020 0919   EOSABS 0.3 06/16/2020 0919   BASOSABS 0.1 06/16/2020 0919    BMET    Component Value Date/Time   NA 138 08/29/2020 0137   K 4.5 08/29/2020 0137   CL 103 08/29/2020 0137   CO2 26 08/29/2020 0137   GLUCOSE 121 (H) 08/29/2020 0137   BUN 23 (H) 08/29/2020 0137   CREATININE 1.01 (H) 08/29/2020 0137   CREATININE 0.87 06/16/2020 0919   CALCIUM 9.3 08/29/2020 0137   GFRNONAA >60 08/29/2020 0137   GFRNONAA >60 06/16/2020 0919   GFRAA >60 08/13/2019 0821    INR No results found for: INR  No intake or output data in the 24 hours ending 08/29/20 1041   Assessment/plan:  54 y.o. female is s/p stent of left common iliac vein for May Thurner syndrome with previous history of DVT on 2 separate occasions and had been prescribed indefinite Xarelto.  She will follow-up in 4 to 6 weeks with IVC iliac duplex to evaluate her stent patency.  Ultimately I think we could keep her on Xarelto for another 3 to 6 months and plan for transition to Plavix indefinitely afterwards given that she has aspirin allergy.  We will get thigh-high compression stockings ordered today and plan for discharge  later    Dashton Czerwinski C. Randie Heinz, MD [facility] [place] Office: [phone] Pager: [phone]  08/29/2020 10:41 AM

## 2020-08-29 NOTE — Plan of Care (Signed)
  Problem: Education: Goal: Knowledge of General Education information will improve Description: Including pain rating scale, medication(s)/side effects and non-pharmacologic comfort measures Outcome: Adequate for Discharge   Problem: Health Behavior/Discharge Planning: Goal: Ability to manage health-related needs will improve Outcome: Adequate for Discharge   Problem: Clinical Measurements: Goal: Ability to maintain clinical measurements within normal limits will improve Outcome: Adequate for Discharge Goal: Will remain free from infection Outcome: Adequate for Discharge Goal: Diagnostic test results will improve Outcome: Adequate for Discharge Goal: Respiratory complications will improve Outcome: Adequate for Discharge Goal: Cardiovascular complication will be avoided Outcome: Adequate for Discharge   Problem: Activity: Goal: Risk for activity intolerance will decrease Outcome: Adequate for Discharge   Problem: Nutrition: Goal: Adequate nutrition will be maintained Outcome: Adequate for Discharge   Problem: Coping: Goal: Level of anxiety will decrease Outcome: Adequate for Discharge   Problem: Elimination: Goal: Will not experience complications related to bowel motility Outcome: Adequate for Discharge Goal: Will not experience complications related to urinary retention Outcome: Adequate for Discharge   Problem: Pain Managment: Goal: General experience of comfort will improve Outcome: Adequate for Discharge   Problem: Safety: Goal: Ability to remain free from injury will improve Outcome: Adequate for Discharge   Problem: Skin Integrity: Goal: Risk for impaired skin integrity will decrease Outcome: Adequate for Discharge   Problem: Education: Goal: Understanding of CV disease, CV risk reduction, and recovery process will improve Outcome: Adequate for Discharge Goal: Individualized Educational Video(s) Outcome: Adequate for Discharge   Problem:  Activity: Goal: Ability to return to baseline activity level will improve Outcome: Adequate for Discharge   Problem: Cardiovascular: Goal: Ability to achieve and maintain adequate cardiovascular perfusion will improve Outcome: Adequate for Discharge Goal: Vascular access site(s) Level 0-1 will be maintained Outcome: Adequate for Discharge   Problem: Health Behavior/Discharge Planning: Goal: Ability to safely manage health-related needs after discharge will improve Outcome: Adequate for Discharge   Problem: Education: Goal: Knowledge of prescribed regimen will improve Outcome: Adequate for Discharge   Problem: Bowel/Gastric: Goal: Gastrointestinal status for postoperative course will improve Outcome: Adequate for Discharge   Problem: Clinical Measurements: Goal: Signs and symptoms of graft occlusion will improve Outcome: Adequate for Discharge   Problem: Skin Integrity: Goal: Demonstration of wound healing without infection will improve Outcome: Adequate for Discharge

## 2020-08-29 NOTE — Discharge Instructions (Signed)
° °  [facility] [place] ° °Discharge Instructions ° °Lower Extremity Angiogram; Angioplasty/Stenting ° °Please refer to the following instructions for your post-procedure care. Your surgeon or physician assistant will discuss any changes with you. ° °Activity ° °Avoid lifting more than 8 pounds (1 gallons of milk) for 72 hours (3 days) after your procedure. You may walk as much as you can tolerate. It's OK to drive after 72 hours. ° °Bathing/Showering ° °You may shower the day after your procedure. If you have a bandage, you may remove it at 24- 48 hours. Clean your incision site with mild soap and water. Pat the area dry with a clean towel. ° °Diet ° °Resume your pre-procedure diet. There are no special food restrictions following this procedure. All patients with peripheral vascular disease should follow a low fat/low cholesterol diet. In order to heal from your surgery, it is CRITICAL to get adequate nutrition. Your body requires vitamins, minerals, and protein. Vegetables are the best source of vitamins and minerals. Vegetables also provide the perfect balance of protein. Processed food has little nutritional value, so try to avoid this. ° °Medications ° °Resume taking all of your medications unless your doctor tells you not to. If your incision is causing pain, you may take over-the-counter pain relievers such as acetaminophen (Tylenol) ° °Follow Up ° °Follow up will be arranged at the time of your procedure. You may have an office visit scheduled or may be scheduled for surgery. Ask your surgeon if you have any questions. ° °Please call us immediately for any of the following conditions: °•Severe or worsening pain your legs or feet at rest or with walking. °•Increased pain, redness, drainage at your groin puncture site. °•Fever of 101 degrees or higher. °•If you have any mild or slow bleeding from your puncture site: lie down, apply firm constant pressure over the area with a piece of  gauze or a clean wash cloth for 30 minutes- no peeking!, call 911 right away if you are still bleeding after 30 minutes, or if the bleeding is heavy and unmanageable. ° °Reduce your risk factors of vascular disease: ° °Stop smoking. If you would like help call [facility] at 1-800-QUIT-NOW (1-800-784-8669) or [place] at 336-586-4000. °Manage your cholesterol °Maintain a desired weight °Control your diabetes °Keep your blood pressure down ° °If you have any questions, please call the office at [phone] ° °

## 2020-08-30 DIAGNOSIS — M7989 Other specified soft tissue disorders: Secondary | ICD-10-CM

## 2020-08-30 NOTE — Discharge Summary (Signed)
Discharge Summary  Patient ID: Monica Perez [id number] 54 y.o. 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 Chrystel Barefield is a 54 year old female with history of extensive left lower extremity DVT.  She had been maintained on Xarelto however suffering with chronic edema of left lower extremity.  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  She tolerated this procedure well and was admitted to the hospital postoperatively.  POD #1 left popliteal catheterization site was without bleeding or hematoma.  Patient was tolerating a regular diet, ambulating without difficulty, and ready for discharge home.  She was measured for and prescribed a thigh-high compression stocking to wear regularly on her left lower extremity until office follow-up.  Patient has an allergy to aspirin thus will go home with Xarelto only.  She will follow-up with Dr. Randie Heinz in 4 to 6 weeks with a IVC/iliac vein duplex.  Discharge instructions were reviewed with the patient and she voiced her understanding.  She was discharged home in stable condition.  Consults: None  Treatments: surgery: Left lower extremity and central venogram with stenting of left common and external iliac veins by Dr. Randie Heinz on 08/28/2020  Discharge Exam: See progress note 08/29/20 Vitals:   08/28/20 2211 08/28/20 2215  BP:  106/61  Pulse:    Resp:    Temp: 98.4 F (36.9 C)   SpO2:       Disposition: Discharge disposition: 01-Home or Self Care       Patient Instructions:   Allergies as of 08/29/2020      Reactions   Erythromycin Hives   Aspirin Other (See Comments)   bleeding   Atovaquone-proguanil Hcl Nausea Only   Other Hives   Surgical glue   Penicillins Hives, Rash   Reaction: Childhood   Azithromycin Hives, Itching   Erythromycin Base Hives, Rash      Morphine And Related Hives, Rash   Sulfa Antibiotics Hives, Rash      Sulfamethoxazole-trimethoprim Hives, Rash         Medication List    TAKE these medications   CALCIUM PO Take 1 tablet by mouth daily.   clotrimazole 10 MG troche Commonly known as: MYCELEX Take 10 mg by mouth every other day. At bedtime   desvenlafaxine 100 MG 24 hr tablet Commonly known as: PRISTIQ Take 100 mg by mouth at bedtime.   fluconazole 150 MG tablet Commonly known as: DIFLUCAN Take 150 mg by mouth once a week.   lovastatin 20 MG tablet Commonly known as: MEVACOR Take 20 mg by mouth at bedtime.   MULTI-VITAMIN DAILY PO Take 1 tablet by mouth daily.   rivaroxaban 10 MG Tabs tablet Commonly known as: Xarelto Take 1 tablet (10 mg total) by mouth daily. What changed: when to take this   Vasculera Tabs Take 630 mg by mouth at bedtime.   Vitamin D3 Super Strength 50 MCG (2000 UT) Tabs Generic drug: Cholecalciferol Take 2,000 Units by mouth daily.      Activity: activity as tolerated; continue thigh-high compression of left lower extremity until follow-up appointment Diet: regular diet Wound Care: none needed  Follow-up with Dr. Randie Heinz in 4 to 6 weeks with IVC/iliac venous duplex  Signed: Emilie Rutter, PA-C 08/30/2020 10:40 AM [facility] Office: [phone]

## 2020-08-31 ENCOUNTER — Telehealth: Payer: Self-pay

## 2020-08-31 ENCOUNTER — Other Ambulatory Visit: Payer: Self-pay

## 2020-08-31 MED ORDER — SODIUM CHLORIDE 0.9 % IV SOLN: 250.0000 mL | INTRAVENOUS | Status: AC | PRN

## 2020-08-31 MED ORDER — SODIUM CHLORIDE 0.9% FLUSH: 3.0000 mL | INTRAVENOUS | Status: DC | PRN

## 2020-08-31 MED ORDER — SODIUM CHLORIDE 0.9% FLUSH: 3.0000 mL | Freq: Two times a day (BID) | INTRAVENOUS | Status: AC

## 2020-08-31 NOTE — Telephone Encounter (Signed)
Via Mychart message and phone call/ saw patient site and spoke with patient. She has some bruising around her venopuncture site, which I advised was normal. She has some redness around the site and now has hives (not present in the picture, recent development) spreading up the leg. Per patient, this happened the last time she had surgery as well. She is unsure what she is allergic to, but she had to have a steroid shot. Advised patient to call PCP/urgent care and get treatment for like allergic response.

## 2020-09-04 ENCOUNTER — Encounter (HOSPITAL_COMMUNITY): Payer: Self-pay | Admitting: Vascular Surgery

## 2020-09-04 DIAGNOSIS — Z4889 Encounter for other specified surgical aftercare: Secondary | ICD-10-CM | POA: Insufficient documentation

## 2020-09-04 DIAGNOSIS — Z4789 Encounter for other orthopedic aftercare: Secondary | ICD-10-CM | POA: Insufficient documentation

## 2020-09-05 ENCOUNTER — Encounter (HOSPITAL_COMMUNITY): Payer: Self-pay | Admitting: Vascular Surgery

## 2020-09-11 ENCOUNTER — Telehealth: Payer: Self-pay

## 2020-09-11 NOTE — Telephone Encounter (Signed)
Spoke to pt to let her know likely normal what she is experiencing, L sided low back pain and L leg aches, per MD. MD feels this will likely lessen over time. Pt verbalized understanding of the above, no further questions/concerns at this time.

## 2020-09-19 ENCOUNTER — Other Ambulatory Visit: Payer: Self-pay

## 2020-09-19 DIAGNOSIS — B373 Candidiasis of vulva and vagina: Secondary | ICD-10-CM | POA: Insufficient documentation

## 2020-09-19 DIAGNOSIS — Z8659 Personal history of other mental and behavioral disorders: Secondary | ICD-10-CM | POA: Insufficient documentation

## 2020-09-19 DIAGNOSIS — B3731 Acute candidiasis of vulva and vagina: Secondary | ICD-10-CM | POA: Insufficient documentation

## 2020-09-19 DIAGNOSIS — I824Y9 Acute embolism and thrombosis of unspecified deep veins of unspecified proximal lower extremity: Secondary | ICD-10-CM

## 2020-09-19 DIAGNOSIS — R87619 Unspecified abnormal cytological findings in specimens from cervix uteri: Secondary | ICD-10-CM | POA: Insufficient documentation

## 2020-10-06 ENCOUNTER — Ambulatory Visit (HOSPITAL_COMMUNITY)
Admission: RE | Admit: 2020-10-06 | Discharge: 2020-10-06 | Disposition: A | Discharge status: Home / Self Care | Payer: BC Managed Care – PPO | Source: Ambulatory Visit | Attending: Vascular Surgery | Admitting: Vascular Surgery

## 2020-10-06 ENCOUNTER — Encounter: Payer: Self-pay | Admitting: Vascular Surgery

## 2020-10-06 ENCOUNTER — Ambulatory Visit (INDEPENDENT_AMBULATORY_CARE_PROVIDER_SITE_OTHER): Payer: BC Managed Care – PPO | Admitting: Vascular Surgery

## 2020-10-06 ENCOUNTER — Other Ambulatory Visit: Payer: Self-pay

## 2020-10-06 VITALS — BP 109/76 | HR 75 | Temp 98.0°F | Resp 20 | Ht 66.0 in | Wt 224.0 lb

## 2020-10-06 DIAGNOSIS — I871 Compression of vein: Secondary | ICD-10-CM

## 2020-10-06 DIAGNOSIS — I824Y9 Acute embolism and thrombosis of unspecified deep veins of unspecified proximal lower extremity: Secondary | ICD-10-CM | POA: Diagnosis not present

## 2020-10-06 NOTE — Progress Notes (Signed)
Patient ID: Monica Perez, female   DOB: 09-Apr-1966, 55 y.o.   MRN: [id number]  Reason for Consult: Routine Post Op   Referred by Radiontchenko, Alexei, *  Subjective:     HPI:  Monica Perez is a 55 y.o. female with history of left lower extremity swelling and pain and a history of DVT approximately 3 years ago. She has been anticoagulated since the time of her initial DVT. Patient was unable to wear shoes and had severely limited her lifestyle due to swelling. She has undergone hypercoagulable work-up in the past which was negative. She had a reflux study in our office and we planned left lower extremity venography and stenting of her left common iliac vein. Since that time she has done very well. She did have pain in her back and pelvis for about 3 weeks she also had some calf pain in the middle of the night for a couple weeks but all of this has resolved at this time. This time patient states that she is feeling great she has no complaints. She continues on Xarelto. She does have an aspirin allergy.  Past Medical History:  Diagnosis Date  . Anxiety   . DVT of lower extremity (deep venous thrombosis) (HCC)   . Hyperlipemia   . Peripheral arterial disease (HCC)   . PONV (postoperative nausea and vomiting)    History reviewed. No pertinent family history. Past Surgical History:  Procedure Laterality Date  . APPENDECTOMY    . BUNIONECTOMY Right 07/20/2020   Procedure: Excision right bunionette;  Surgeon: Toni Arthurs, MD;  Location: [facility];  Service: Orthopedics;  Laterality: Right;  Fifth toe  . LOWER EXTREMITY VENOGRAPHY Left 08/28/2020   Procedure: LOWER EXTREMITY VENOGRAPHY;  Surgeon: Maeola Harman, MD;  Location: [facility] INVASIVE CV LAB;  Service: Cardiovascular;  Laterality: Left;  . ORIF FOREARM FRACTURE    . PERIPHERAL VASCULAR INTERVENTION Left 08/28/2020   Procedure: PERIPHERAL VASCULAR INTERVENTION;  Surgeon: Maeola Harman, MD;   Location: [facility] INVASIVE CV LAB;  Service: Cardiovascular;  Laterality: Left;  common iliac    Short Social History:  Social History   Tobacco Use  . Smoking status: Never Smoker  . Smokeless tobacco: Never Used  Substance Use Topics  . Alcohol use: Yes    Comment: once a month    Allergies  Allergen Reactions  . Erythromycin Hives  . Lactose Diarrhea  . Aspirin Other (See Comments)    bleeding  . Atovaquone-Proguanil Hcl Nausea Only  . Other Hives    Surgical glue  . Penicillins Hives and Rash    Reaction: Childhood   . Azithromycin Hives and Itching  . Erythromycin Base Hives and Rash       . Morphine And Related Hives and Rash  . Sulfa Antibiotics Hives and Rash       . Sulfamethoxazole-Trimethoprim Hives and Rash         Current Outpatient Medications  Medication Sig Dispense Refill  . CALCIUM PO Take 1 tablet by mouth daily.    . Cholecalciferol (VITAMIN D3 SUPER STRENGTH) 50 MCG (2000 UT) TABS Take 2,000 Units by mouth daily.    Marland Kitchen desvenlafaxine (PRISTIQ) 100 MG 24 hr tablet Take 100 mg by mouth at bedtime.    . Dietary Management Product (VASCULERA) TABS Take 630 mg by mouth at bedtime.    . lovastatin (MEVACOR) 20 MG tablet Take 20 mg by mouth at bedtime.    . Multiple Vitamin (MULTI-VITAMIN DAILY  PO) Take 1 tablet by mouth daily.    . rivaroxaban (XARELTO) 10 MG TABS tablet Take 1 tablet (10 mg total) by mouth daily. (Patient taking differently: Take 10 mg by mouth at bedtime.) 30 tablet 6   Current Facility-Administered Medications  Medication Dose Route Frequency Provider Last Rate Last Admin  . 0.9 %  sodium chloride infusion  250 mL Intravenous PRN Nada Libman, MD      . sodium chloride flush (NS) 0.9 % injection 3 mL  3 mL Intravenous Q12H Nada Libman, MD      . sodium chloride flush (NS) 0.9 % injection 3 mL  3 mL Intravenous PRN Nada Libman, MD        Review of Systems  Constitutional:  Constitutional negative. HENT: HENT  negative.  Eyes: Eyes negative.  GI: Gastrointestinal negative.  Musculoskeletal:       Right foot pain Skin: Skin negative.  Neurological: Neurological negative. Hematologic: Hematologic/lymphatic negative.  Psychiatric: Psychiatric negative.        Objective:  Objective   Vitals:   10/06/20 0830  BP: 109/76  Pulse: 75  Resp: 20  Temp: 98 F (36.7 C)  SpO2: 97%  Weight: 224 lb (101.6 kg)  Height: 5\' 6"  (1.676 m)   Body mass index is 36.15 kg/m.  Physical Exam HENT:     Head: Normocephalic.     Nose:     Comments: Wearing a mask Eyes:     Pupils: Pupils are equal, round, and reactive to light.  Cardiovascular:     Rate and Rhythm: Normal rate and regular rhythm.     Pulses: Normal pulses.  Pulmonary:     Effort: Pulmonary effort is normal.  Abdominal:     General: Abdomen is flat.     Palpations: Abdomen is soft. There is no mass.  Musculoskeletal:        General: Normal range of motion.     Cervical back: Normal range of motion and neck supple.     Right lower leg: No edema.     Left lower leg: No edema.  Skin:    General: Skin is warm.     Capillary Refill: Capillary refill takes less than 2 seconds.  Neurological:     General: No focal deficit present.     Mental Status: She is alert.  Psychiatric:        Mood and Affect: Mood normal.        Behavior: Behavior normal.        Thought Content: Thought content normal.        Judgment: Judgment normal.     Data: +-------------------+---------+-----------+---------+-----------+--------+      CIV    RT-PatentRT-ThrombusLT-PatentLT-ThrombusComments  +-------------------+---------+-----------+---------+-----------+--------+  Common Iliac Prox             patent             +-------------------+---------+-----------+---------+-----------+--------+  Common Iliac Mid              patent              +-------------------+---------+-----------+---------+-----------+--------+  Common Iliac Distal            patent             +-------------------+---------+-----------+---------+-----------+--------+      +-------------------------+---------+-----------+---------+-----------+----  ----+        EIV       RT-PatentRT-ThrombusLT-PatentLT-ThrombusComments  +-------------------------+---------+-----------+---------+-----------+----  ----+  External Iliac Vein Prox  patent              +-------------------------+---------+-----------+---------+-----------+----  ----+  External Iliac Vein Mid             patent              +-------------------------+---------+-----------+---------+-----------+----  ----+  External Iliac Vein               patent              Distal                                       +-------------------------+---------+-----------+---------+-----------+----  ----+     Summary:  IVC/Iliac: Visualization of Inferior Vena Cava was limited.  Widely patent left common iliac and external iliac vein stenting.        Assessment/Plan:    38 old female follows up after stenting for May Thurner syndrome.  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  She will continue Xarelto for another 6 months at that time we will consider transition to indefinite Plavix as patient has aspirin allergy.  She can wear compression stockings as needed.  She is okay for travel I discussed with her the need to avoid being sedentary and stay hydrated with traveling.  As long she has no further issues we will see her in 6 months with repeat IVC iliac duplex      Maeola Harman MD [facility]

## 2020-10-10 ENCOUNTER — Other Ambulatory Visit: Payer: Self-pay

## 2020-10-10 DIAGNOSIS — I871 Compression of vein: Secondary | ICD-10-CM

## 2020-11-18 ENCOUNTER — Encounter: Payer: Self-pay | Admitting: Family

## 2020-12-18 ENCOUNTER — Other Ambulatory Visit: Payer: BC Managed Care – PPO

## 2020-12-18 ENCOUNTER — Ambulatory Visit: Payer: BC Managed Care – PPO | Admitting: Family

## 2020-12-18 ENCOUNTER — Ambulatory Visit: Payer: BC Managed Care – PPO | Admitting: Hematology & Oncology

## 2021-01-25 ENCOUNTER — Other Ambulatory Visit: Payer: Self-pay | Admitting: Hematology & Oncology

## 2021-01-25 DIAGNOSIS — Z86718 Personal history of other venous thrombosis and embolism: Secondary | ICD-10-CM

## 2021-05-16 ENCOUNTER — Emergency Department (HOSPITAL_BASED_OUTPATIENT_CLINIC_OR_DEPARTMENT_OTHER)
Admission: EM | Admit: 2021-05-16 | Discharge: 2021-05-16 | Disposition: A | Discharge status: Home / Self Care | Payer: BC Managed Care – PPO | Attending: Emergency Medicine | Admitting: Emergency Medicine

## 2021-05-16 ENCOUNTER — Other Ambulatory Visit: Payer: Self-pay

## 2021-05-16 ENCOUNTER — Encounter (HOSPITAL_BASED_OUTPATIENT_CLINIC_OR_DEPARTMENT_OTHER): Payer: Self-pay | Admitting: Urology

## 2021-05-16 ENCOUNTER — Ambulatory Visit: Payer: BC Managed Care – PPO | Admitting: Vascular Surgery

## 2021-05-16 ENCOUNTER — Encounter (HOSPITAL_COMMUNITY): Payer: BC Managed Care – PPO

## 2021-05-16 ENCOUNTER — Emergency Department (HOSPITAL_BASED_OUTPATIENT_CLINIC_OR_DEPARTMENT_OTHER): Payer: BC Managed Care – PPO | Admitting: Radiology

## 2021-05-16 DIAGNOSIS — U071 COVID-19: Secondary | ICD-10-CM | POA: Diagnosis not present

## 2021-05-16 LAB — RESP PANEL BY RT-PCR (FLU A&B, COVID) ARPGX2
Influenza A by PCR: NEGATIVE
Influenza B by PCR: NEGATIVE
SARS Coronavirus 2 by RT PCR: POSITIVE — AB

## 2021-05-16 MED ORDER — DEXAMETHASONE 4 MG PO TABS
10.0000 mg | ORAL_TABLET | Freq: Once | ORAL | Status: AC
  Administered 2021-05-16: 10 mg via ORAL

## 2021-05-16 MED ORDER — ALBUTEROL SULFATE HFA 108 (90 BASE) MCG/ACT IN AERS
2.0000 | INHALATION_SPRAY | Freq: Once | RESPIRATORY_TRACT | Status: AC
  Administered 2021-05-16: 2 via RESPIRATORY_TRACT
  Filled 2021-05-16: qty 6.7

## 2021-05-16 MED ORDER — BENZONATATE 100 MG PO CAPS: 100.0000 mg | ORAL_CAPSULE | Freq: Three times a day (TID) | ORAL | 0 refills | Status: DC

## 2021-05-16 NOTE — ED Provider Notes (Signed)
[facility] EMERGENCY DEPT Provider Note   CSN: [id number] Arrival date & time: 05/16/21  1530     History Chief Complaint  Patient presents with   Covid Positive    Monica Perez is a 55 y.o. female.  Tested positive for COVID 11 days ago.  Still with cough and sputum production.  The history is provided by the patient.  URI Presenting symptoms: congestion and cough   Presenting symptoms: no ear pain, no fever and no sore throat   Severity:  Mild Onset quality:  Gradual Duration:  11 days Timing:  Intermittent Progression:  Waxing and waning Chronicity:  New Relieved by:  Nothing Worsened by:  Nothing Associated symptoms: no arthralgias       Past Medical History:  Diagnosis Date   Anxiety    DVT of lower extremity (deep venous thrombosis) (HCC)    Hyperlipemia    Peripheral arterial disease (HCC)    PONV (postoperative nausea and vomiting)     Patient Active Problem List   Diagnosis Date Noted   Abnormal cervical Papanicolaou smear 09/19/2020   Candidiasis of vagina 09/19/2020   History of depression 09/19/2020   Encounter for orthopedic follow-up care 09/04/2020   Encounter for postoperative care 09/04/2020   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   Tailor's bunion of right foot 06/28/2020   Anxiety 03/15/2020   Pain in joint of left shoulder 02/02/2020   Colonoscopy refused 08/19/2019   Severe obesity (BMI 35.0-39.9) with comorbidity (HCC) 06/23/2019   Localized osteoporosis without current pathological fracture 04/09/2018   Mononeuropathy 10/06/2017   Obesity (BMI 35.0-39.9 without comorbidity) 10/06/2017   Anticoagulation adequate 06/25/2017   DVT (deep venous thrombosis) (HCC) 06/19/2017   Post-thrombotic syndrome of left lower extremity 06/19/2017   Acute deep vein thrombosis (DVT) of left lower extremity (HCC) 06/17/2017   History of deep venous thrombosis (DVT) of distal vein of left lower extremity 06/17/2017   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   Drusen of both optic discs 06/01/2015   Left epiretinal membrane 06/01/2015   Dyslipidemia 05/05/2015   Posterior vitreous detachment of both eyes 04/27/2015   Chronic idiopathic urticaria 04/21/2014   Penicillin allergy 04/21/2014   Urticaria 04/01/2014   Vitamin D deficiency 04/01/2014   Allergic rhinosinusitis 07/30/2013   S/P LASIK surgery 10/17/2012   Left hip pain 08/20/2012   Myopia 07/27/2012   Elevated triglycerides with high cholesterol 09/02/1998    Past Surgical History:  Procedure Laterality Date   APPENDECTOMY     BUNIONECTOMY Right 07/20/2020   Procedure: Excision right bunionette;  Surgeon: Toni Arthurs, MD;  Location: [facility];  Service: Orthopedics;  Laterality: Right;  Fifth toe   LOWER EXTREMITY VENOGRAPHY Left 08/28/2020   Procedure: LOWER EXTREMITY VENOGRAPHY;  Surgeon: Maeola Harman, MD;  Location: [facility] INVASIVE CV LAB;  Service: Cardiovascular;  Laterality: Left;   ORIF FOREARM FRACTURE     PERIPHERAL VASCULAR INTERVENTION Left 08/28/2020   Procedure: PERIPHERAL VASCULAR INTERVENTION;  Surgeon: Maeola Harman, MD;  Location: [facility] INVASIVE CV LAB;  Service: Cardiovascular;  Laterality: Left;  common iliac     OB History   No obstetric history on file.     History reviewed. No pertinent family history.  Social History   Tobacco Use   Smoking status: Never   Smokeless tobacco: Never  Vaping Use   Vaping Use: Never used  Substance Use Topics   Alcohol use: Yes    Comment: once a month   Drug use: Never  Home Medications Prior to Admission medications   Medication Sig Start Date End Date Taking? Authorizing Provider  benzonatate (TESSALON) 100 MG capsule Take 1 capsule (100 mg total) by mouth every 8 (eight) hours. 05/16/21  Yes Nysa Sarin, DO  CALCIUM PO Take 1 tablet by mouth daily.    [provider]  Cholecalciferol (VITAMIN D3 SUPER STRENGTH) 50 MCG (2000  UT) TABS Take 2,000 Units by mouth daily.    [provider]  desvenlafaxine (PRISTIQ) 100 MG 24 hr tablet Take 100 mg by mouth at bedtime. 08/13/19   [provider]  Dietary Management Product (VASCULERA) TABS Take 630 mg by mouth at bedtime. 08/01/20   [provider]  lovastatin (MEVACOR) 20 MG tablet Take 20 mg by mouth at bedtime. 02/22/19   [provider]  Multiple Vitamin (MULTI-VITAMIN DAILY PO) Take 1 tablet by mouth daily.    [provider]  XARELTO 10 MG TABS tablet TAKE 1 TABLET BY MOUTH DAILY 01/25/21   Josph Macho, MD    Allergies    Erythromycin, Lactose, Aspirin, Atovaquone-proguanil hcl, Other, Penicillins, Azithromycin, Erythromycin base, Morphine and related, Sulfa antibiotics, and Sulfamethoxazole-trimethoprim  Review of Systems   Review of Systems  Constitutional:  Negative for chills and fever.  HENT:  Positive for congestion. Negative for ear pain and sore throat.   Eyes:  Negative for pain and visual disturbance.  Respiratory:  Positive for cough. Negative for shortness of breath.   Cardiovascular:  Negative for chest pain and palpitations.  Gastrointestinal:  Negative for abdominal pain and vomiting.  Genitourinary:  Negative for dysuria and hematuria.  Musculoskeletal:  Negative for arthralgias and back pain.  Skin:  Negative for color change and rash.  Neurological:  Negative for seizures and syncope.  All other systems reviewed and are negative.  Physical Exam Updated Vital Signs BP (!) 149/89 (BP Location: Right Arm)   Pulse 99   Temp 98.1 F (36.7 C)   Resp 20   Ht 5\' 6"  (1.676 m)   Wt 99.8 kg   LMP 09/03/2011   SpO2 95%   BMI 35.51 kg/m   Physical Exam Vitals and nursing note reviewed.  Constitutional:      General: She is not in acute distress.    Appearance: She is well-developed. She is not ill-appearing.  HENT:     Head: Normocephalic and atraumatic.     Nose: Nose normal.      Mouth/Throat:     Mouth: Mucous membranes are moist.  Eyes:     Extraocular Movements: Extraocular movements intact.     Conjunctiva/sclera: Conjunctivae normal.     Pupils: Pupils are equal, round, and reactive to light.  Cardiovascular:     Rate and Rhythm: Normal rate and regular rhythm.     Pulses: Normal pulses.     Heart sounds: Normal heart sounds. No murmur heard. Pulmonary:     Effort: Pulmonary effort is normal. No respiratory distress.     Breath sounds: Normal breath sounds.  Abdominal:     Palpations: Abdomen is soft.     Tenderness: There is no abdominal tenderness.  Musculoskeletal:     Cervical back: Normal range of motion and neck supple.  Skin:    General: Skin is warm and dry.     Capillary Refill: Capillary refill takes less than 2 seconds.  Neurological:     General: No focal deficit present.     Mental Status: She is alert.  Psychiatric:  Mood and Affect: Mood normal.    ED Results / Procedures / Treatments   Labs (all labs ordered are listed, but only abnormal results are displayed) Labs Reviewed  RESP PANEL BY RT-PCR (FLU A&B, COVID) [id number]    EKG None  Radiology DG Chest 2 View  Result Date: 05/16/2021 CLINICAL DATA:  COVID positive, cough EXAM: CHEST - 2 VIEW COMPARISON:  Chest radiograph 09/24/2011 FINDINGS: The cardiomediastinal silhouette is within normal limits. There is no focal consolidation or pulmonary edema. There is no pleural effusion or pneumothorax. There is no acute osseous abnormality. IMPRESSION: No radiographic evidence of acute cardiopulmonary process. Electronically Signed   By: Lesia Hausen M.D.   On: 05/16/2021 16:45    Procedures Procedures   Medications Ordered in ED Medications  albuterol (VENTOLIN HFA) 108 (90 Base) MCG/ACT inhaler 2 puff (has no administration in time range)  dexamethasone (DECADRON) tablet 10 mg (10 mg Oral Given 05/16/21 1815)    ED Course  I have reviewed the triage vital signs and the  nursing notes.  Pertinent labs & imaging results that were available during my care of the patient were reviewed by me and considered in my medical decision making (see chart for details).    MDM Rules/Calculators/A&P                           [name] is here with URI symptoms.  Tested positive for COVID 11 days ago.  Vital signs are unremarkable.  Chest x-ray with no evidence of infection.  She has history of bronchitis in the past but not a smoker.  We will give her albuterol and Decadron for symptomatic relief.  Will prescribe Tessalon Perles.  Discharged in good condition.  Understands return precautions.  She is already on Xarelto and have very low suspicion for PE.  Reassuring vitals.  Overall suspect postviral cough.  This chart was dictated using voice recognition software.  Despite best efforts to proofread,  errors can occur which can change the documentation meaning.   Final Clinical Impression(s) / ED Diagnoses Final diagnoses:  COVID-19    Rx / DC Orders ED Discharge Orders          Ordered    benzonatate (TESSALON) 100 MG capsule  Every 8 hours        05/16/21 1752             Virgina Norfolk, DO 05/16/21 1822

## 2021-05-16 NOTE — ED Triage Notes (Signed)
Tested positive for covid 11 days ago while on cruise, home test again this am.  Tested positive.  Cough, nasal congestion, body aches.

## 2021-05-28 NOTE — Progress Notes (Signed)
Name: Monica Perez  MRN/ DOB: [id number], 1965/09/11    Age/ Sex: 55 y.o., female    PCP: Verl Bangs, MD   Reason for Endocrinology Evaluation: Hypercalcemia      Date of Initial Endocrinology Evaluation: 05/29/2021     HPI: Monica Perez is a 55 y.o. female with a past medical history of Dyslipidemia, Depression and anxiety . The patient presented for initial endocrinology clinic visit on 05/29/2021 for consultative assistance with her Hypercalcemia .     Monica Perez indicates that she was diagnosed with hypercalcemia in 03/2021 with a serum calcium of 11.0 mg/dL , albumin 4.8 g/dL. Since that time, she denies  experienced symptoms of constipation, polyuria, polydipsia. She takes  use of over the counter calcium (MVI and every other day calcium ), lithium, or HCTZ.     She is on vitamin D 2000 iu daily   She denies  history of kidney stones, kidney disease, or granulomatous disease.She has elevated LFT's, was though fatty liver , after losing 15 lbs her lFT's improved .  She has osteoporosis , had a broken arm at age 55 and toes as an adult. Daily dietary calcium intake: 12 servings . She denies family history of osteoporosis, parathyroid disease.   Historically she is lactose intolerant but has been on low dairy   Questionable FH of thyroid disease in mother       HISTORY:  Past Medical History:  Past Medical History:  Diagnosis Date   Anxiety    DVT of lower extremity (deep venous thrombosis) (HCC)    Hyperlipemia    Peripheral arterial disease (HCC)    PONV (postoperative nausea and vomiting)    Past Surgical History:  Past Surgical History:  Procedure Laterality Date   APPENDECTOMY     BUNIONECTOMY Right 07/20/2020   Procedure: Excision right bunionette;  Surgeon: Toni Arthurs, MD;  Location: [facility];  Service: Orthopedics;  Laterality: Right;  Fifth toe   LOWER EXTREMITY VENOGRAPHY Left 08/28/2020   Procedure: LOWER  EXTREMITY VENOGRAPHY;  Surgeon: Maeola Harman, MD;  Location: [facility] INVASIVE CV LAB;  Service: Cardiovascular;  Laterality: Left;   ORIF FOREARM FRACTURE     PERIPHERAL VASCULAR INTERVENTION Left 08/28/2020   Procedure: PERIPHERAL VASCULAR INTERVENTION;  Surgeon: Maeola Harman, MD;  Location: [facility] INVASIVE CV LAB;  Service: Cardiovascular;  Laterality: Left;  common iliac    Social History:  reports that she has never smoked. She has never used smokeless tobacco. She reports current alcohol use. She reports that she does not use drugs. Family History: family history is not on file.   HOME MEDICATIONS: Allergies as of 05/29/2021       Reactions   Erythromycin Hives   Lactose Diarrhea   Aspirin Other (See Comments)   bleeding   Atovaquone-proguanil Hcl Nausea Only   Other Hives   Surgical glue   Penicillins Hives, Rash   Reaction: Childhood   Azithromycin Hives, Itching   Erythromycin Base Hives, Rash      Morphine And Related Hives, Rash   Sulfa Antibiotics Hives, Rash      Sulfamethoxazole-trimethoprim Hives, Rash           Medication List        Accurate as of May 29, 2021  8:06 AM. If you have any questions, ask your nurse or doctor.          benzonatate 100 MG capsule Commonly known as: TESSALON Take 1 capsule (  100 mg total) by mouth every 8 (eight) hours.   CALCIUM PO Take 1 tablet by mouth daily.   desvenlafaxine 100 MG 24 hr tablet Commonly known as: PRISTIQ Take 100 mg by mouth at bedtime.   lovastatin 20 MG tablet Commonly known as: MEVACOR Take 20 mg by mouth at bedtime.   MULTI-VITAMIN DAILY PO Take 1 tablet by mouth daily.   Vasculera Tabs Take 630 mg by mouth at bedtime.   Vitamin D3 Super Strength 50 MCG (2000 UT) Tabs Generic drug: Cholecalciferol Take 2,000 Units by mouth daily.   Xarelto 10 MG Tabs tablet Generic drug: rivaroxaban TAKE 1 TABLET BY MOUTH DAILY          REVIEW OF SYSTEMS: A  comprehensive ROS was conducted with the patient and is negative except as per HPI     OBJECTIVE:  VS: BP 116/70 (BP Location: Left Arm, Patient Position: Sitting, Cuff Size: Large)   Pulse 99   Ht 5\' 6"  (1.676 m)   Wt 220 lb (99.8 kg)   LMP 09/03/2011   SpO2 98%   BMI 35.51 kg/m    Wt Readings from Last 3 Encounters:  05/29/21 220 lb (99.8 kg)  05/16/21 220 lb (99.8 kg)  10/06/20 224 lb (101.6 kg)     EXAM: General: Pt appears well and is in NAD  Neck: General: Supple without adenopathy. Thyroid: Thyroid size normal.  No goiter or nodules appreciated.   Lungs: Clear with good BS bilat with no rales, rhonchi, or wheezes  Heart: Auscultation: RRR.  Abdomen: Normoactive bowel sounds, soft, nontender, without masses or organomegaly palpable  Extremities:  BL LE: No pretibial edema normal ROM and strength.  Skin: Hair: Texture and amount normal with gender appropriate distribution Skin Inspection: No rashes Skin Palpation: Skin temperature, texture, and thickness normal to palpation  Mental Status: Judgment, insight: Intact Orientation: Oriented to time, place, and person Mood and affect: No depression, anxiety, or agitation     DATA REVIEWED:   Results for Monica Perez (MRN Monica Perez) as of 05/31/2021 09:10  Ref. Range 05/29/2021 08:36  Sodium Latest Ref Range: 135 - 145 mEq/L 139  Potassium Latest Ref Range: 3.5 - 5.1 mEq/L 4.6  Chloride Latest Ref Range: 96 - 112 mEq/L 103  CO2 Latest Ref Range: 19 - 32 mEq/L 25  Glucose Latest Ref Range: 70 - 99 mg/dL 05/31/2021 (H)  BUN Latest Ref Range: 6 - 23 mg/dL 15  Creatinine Latest Ref Range: 0.40 - 1.20 mg/dL 607  Calcium Latest Ref Range: 8.4 - 10.5 mg/dL 3.71 (H)  Calcium Ionized Latest Ref Range: 4.8 - 5.6 mg/dL 06.2 (H)  Albumin Latest Ref Range: 3.5 - 5.2 g/dL 4.6  GFR Latest Ref Range: >60.00 mL/min 78.53  VITD Latest Ref Range: 30.00 - 100.00 ng/mL 33.18  PTH, Intact Latest Ref Range: 16 - 77 pg/mL 43     DXA  06/02/2020  Ap spine T -score -1.8   Z- score 2.3 RFN                      -2.1                 -1.9    ASSESSMENT/PLAN/RECOMMENDATIONS:   Hyperparathyroidism /Hypercalcemia :    - Pt asymptomatic  - Differential diagnosis includes familial hypercalcinuric hypercalcemia (FHH) vs Primary Hyperparathyroidism (pHPT)  - It is important to differentiate between the two, as FHH does not cause any organ damage and does not require  further follow up , on the other hand pHPT could cause end organ damage and would require further evaluation.  - At this time she will stop all OTC calcium supplements and follow recommendations as below  - Will hold off on 24- hr urine collection due to the fact that she is on OTC calcium, will postpone until next visit   Recommendations  - Stay Hydrated  - Continue Vitamin D 2000 iu daily  - Maintain 2-3 servings of dietary calcium daily  - STOP OTC calcium      2. Low Bone density :  -  No prior anti-resorptive treatment  - Maintain 2-3 servings of dietary calcium  - Continue Vitamin D 2000 iu daily  - Will repeat DXA by 06/2022  F/U in 4 months   Signed electronically by: Lyndle Herrlich, MD  [facility] End[facility]in[facility]  [facility] [address] [place] [place], [place] [address] Phone: [phone] FAX: [phone]   CC: Verl Bangs, MD [address] [place] [address] Phone: [phone] Fax: [phone]   Return to Endocrinology clinic as below: Future Appointments  Date Time Provider Department Center  05/30/2021  8:00 AM [facility]  05/30/2021  9:20 AM Maeola Harman, MD [facility]

## 2021-05-29 ENCOUNTER — Ambulatory Visit (INDEPENDENT_AMBULATORY_CARE_PROVIDER_SITE_OTHER): Payer: BC Managed Care – PPO | Admitting: Internal Medicine

## 2021-05-29 ENCOUNTER — Other Ambulatory Visit: Payer: Self-pay

## 2021-05-29 ENCOUNTER — Encounter: Payer: Self-pay | Admitting: Internal Medicine

## 2021-05-29 DIAGNOSIS — E213 Hyperparathyroidism, unspecified: Secondary | ICD-10-CM

## 2021-05-29 DIAGNOSIS — M8580 Other specified disorders of bone density and structure, unspecified site: Secondary | ICD-10-CM | POA: Diagnosis not present

## 2021-05-29 DIAGNOSIS — M859 Disorder of bone density and structure, unspecified: Secondary | ICD-10-CM

## 2021-05-29 LAB — PARATHYROID HORMONE, INTACT (NO CA): PTH: 43 pg/mL (ref 16–77)

## 2021-05-29 LAB — BASIC METABOLIC PANEL
BUN: 15 mg/dL (ref 6–23)
CO2: 25 mEq/L (ref 19–32)
Calcium: 10.8 mg/dL — ABNORMAL HIGH (ref 8.4–10.5)
Chloride: 103 mEq/L (ref 96–112)
Creatinine, Ser: 0.84 mg/dL (ref 0.40–1.20)
GFR: 78.53 mL/min (ref >60.00)
Glucose, Bld: 126 mg/dL — ABNORMAL HIGH (ref 70–99)
Potassium: 4.6 mEq/L (ref 3.5–5.1)
Sodium: 139 mEq/L (ref 135–145)

## 2021-05-29 LAB — ALBUMIN: Albumin: 4.6 g/dL (ref 3.5–5.2)

## 2021-05-29 LAB — VITAMIN D 25 HYDROXY (VIT D DEFICIENCY, FRACTURES): VITD: 33.18 ng/mL (ref 30.00–100.00)

## 2021-05-29 NOTE — Patient Instructions (Addendum)
-   Stay Hydrated  - Continue Vitamin D 2000 iu daily  - Maintain 2-3 servings of dietary calcium daily  - Will determine over the counter calcium intake after labs      24-Hour Urine Collection  You will be collecting your urine for a 24-hour period of time. Your timer starts with your first urine of the morning (For example - If you first pee at 9AM, your timer will start at 9AM) Throw away your first urine of the morning Collect your urine every time you pee for the next 24 hours STOP your urine collection 24 hours after you started the collection (For example - You would stop at 9AM the day after you started)

## 2021-05-30 ENCOUNTER — Encounter: Payer: Self-pay | Admitting: Vascular Surgery

## 2021-05-30 ENCOUNTER — Other Ambulatory Visit: Payer: Self-pay

## 2021-05-30 ENCOUNTER — Ambulatory Visit (INDEPENDENT_AMBULATORY_CARE_PROVIDER_SITE_OTHER): Payer: BC Managed Care – PPO | Admitting: Vascular Surgery

## 2021-05-30 ENCOUNTER — Ambulatory Visit (HOSPITAL_COMMUNITY)
Admission: RE | Admit: 2021-05-30 | Discharge: 2021-05-30 | Disposition: A | Discharge status: Home / Self Care | Payer: BC Managed Care – PPO | Source: Ambulatory Visit | Attending: Vascular Surgery | Admitting: Vascular Surgery

## 2021-05-30 VITALS — BP 126/76 | HR 80 | Temp 97.7°F | Resp 20 | Ht 66.0 in | Wt 219.0 lb

## 2021-05-30 DIAGNOSIS — I871 Compression of vein: Secondary | ICD-10-CM | POA: Insufficient documentation

## 2021-05-30 LAB — CALCIUM, IONIZED: Calcium, Ion: 5.77 mg/dL — ABNORMAL HIGH (ref 4.8–5.6)

## 2021-05-30 MED ORDER — CLOPIDOGREL BISULFATE 75 MG PO TABS: 75.0000 mg | ORAL_TABLET | Freq: Every day | ORAL | 4 refills | Status: AC

## 2021-05-30 NOTE — Progress Notes (Signed)
Patient ID: Monica Perez, female   DOB: 03/11/1966, 55 y.o.   MRN: [id number]  Reason for Consult: Follow-up   Referred by Radiontchenko, Alexei, *  Subjective:     HPI:  Monica Perez is a 55 y.o. female history of left lower extremity swelling and pain with a remote history of DVT.  When I first met her she was unable to wear shoes she was severely limited with her lifestyle.  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  She has remained on Xarelto since this time.  She has been active traveling.  She did recently have COVID but has recovered without incidence.  Patient has an aspirin allergy.  She has no swelling in her leg at this time.  She has no complaints and has had very good result.  She does occasionally wear compression stockings when needed.  Past Medical History:  Diagnosis Date   Anxiety    DVT of lower extremity (deep venous thrombosis) (HCC)    Hyperlipemia    Peripheral arterial disease (HCC)    PONV (postoperative nausea and vomiting)    History reviewed. No pertinent family history. Past Surgical History:  Procedure Laterality Date   APPENDECTOMY     BUNIONECTOMY Right 07/20/2020   Procedure: Excision right bunionette;  Surgeon: Wylene Simmer, MD;  Location: [place];  Service: Orthopedics;  Laterality: Right;  Fifth toe   LOWER EXTREMITY VENOGRAPHY Left 08/28/2020   Procedure: LOWER EXTREMITY VENOGRAPHY;  Surgeon: Waynetta Sandy, MD;  Location: [place] [facility];  Service: Cardiovascular;  Laterality: Left;   ORIF FOREARM FRACTURE     PERIPHERAL VASCULAR INTERVENTION Left 08/28/2020   Procedure: PERIPHERAL VASCULAR INTERVENTION;  Surgeon: Waynetta Sandy, MD;  Location: [place] CV LAB;  Service: Cardiovascular;  Laterality: Left;  common iliac    Short Social History:  Social History   Tobacco Use   Smoking status: Never   Smokeless tobacco: Never  Substance Use Topics   Alcohol  use: Yes    Comment: once a month    Allergies  Allergen Reactions   Erythromycin Hives   Lactose Diarrhea   Aspirin Other (See Comments)    bleeding   Atovaquone-Proguanil Hcl Nausea Only   Other Hives    Surgical glue   Penicillins Hives and Rash    Reaction: Childhood    Azithromycin Hives and Itching   Erythromycin Base Hives and Rash        Morphine And Related Hives and Rash   Sulfa Antibiotics Hives and Rash        Sulfamethoxazole-Trimethoprim Hives and Rash         Current Outpatient Medications  Medication Sig Dispense Refill   benzonatate (TESSALON) 100 MG capsule Take 1 capsule (100 mg total) by mouth every 8 (eight) hours. 21 capsule 0   CALCIUM PO Take 1 tablet by mouth daily.     Cholecalciferol (VITAMIN D3 SUPER STRENGTH) 50 MCG (2000 UT) TABS Take 2,000 Units by mouth daily.     desvenlafaxine (PRISTIQ) 100 MG 24 hr tablet Take 100 mg by mouth at bedtime.     Dietary Management Product (VASCULERA) TABS Take 630 mg by mouth at bedtime.     lovastatin (MEVACOR) 20 MG tablet Take 20 mg by mouth at bedtime.     Multiple Vitamin (MULTI-VITAMIN DAILY PO) Take 1 tablet by mouth daily.     XARELTO 10 MG TABS tablet TAKE 1 TABLET BY MOUTH DAILY 30  tablet 6   Current Facility-Administered Medications  Medication Dose Route Frequency Provider Last Rate Last Admin   0.9 %  sodium chloride infusion  250 mL Intravenous PRN Serafina Mitchell, MD       sodium chloride flush (NS) 0.9 % injection 3 mL  3 mL Intravenous Q12H Serafina Mitchell, MD       sodium chloride flush (NS) 0.9 % injection 3 mL  3 mL Intravenous PRN Serafina Mitchell, MD        Review of Systems  Constitutional:  Constitutional negative. HENT: HENT negative.  Eyes: Eyes negative.  Respiratory: Respiratory negative.  Cardiovascular: Cardiovascular negative.  GI: Gastrointestinal negative.  Musculoskeletal: Musculoskeletal negative.  Skin: Skin negative.  Neurological: Neurological  negative. Hematologic: Hematologic/lymphatic negative.  Psychiatric: Psychiatric negative.       Objective:  Objective   Vitals:   05/30/21 0854  BP: 126/76  Pulse: 80  Resp: 20  Temp: 97.7 F (36.5 C)  SpO2: 96%  Weight: 219 lb (99.3 kg)  Height: 5' 6"  (1.676 m)   Body mass index is 35.35 kg/m.  Physical Exam HENT:     Head: Normocephalic.     Nose:     Comments: Wearing a mask Eyes:     Pupils: Pupils are equal, round, and reactive to light.  Cardiovascular:     Rate and Rhythm: Normal rate.  Pulmonary:     Effort: Pulmonary effort is normal.  Abdominal:     General: Abdomen is flat.     Palpations: Abdomen is soft.  Musculoskeletal:     Right lower leg: No edema.     Left lower leg: No edema.  Skin:    Capillary Refill: Capillary refill takes less than 2 seconds.  Neurological:     General: No focal deficit present.     Mental Status: She is alert.  Psychiatric:        Mood and Affect: Mood normal.        Behavior: Behavior normal.        Thought Content: Thought content normal.    Data: IVC/Iliac Findings:  +----------+------+--------+--------+     IVC    PatentThrombusComments  +----------+------+--------+--------+  IVC Prox  patent                  +----------+------+--------+--------+  IVC Mid   patent                  +----------+------+--------+--------+  IVC Distalpatent                  +----------+------+--------+--------+      +-------------------+---------+-----------+---------+-----------+--------+          CIV        RT-PatentRT-ThrombusLT-PatentLT-ThrombusComments  +-------------------+---------+-----------+---------+-----------+--------+  Common Iliac Prox                       patent                       +-------------------+---------+-----------+---------+-----------+--------+  Common Iliac Mid                        patent                        +-------------------+---------+-----------+---------+-----------+--------+  Common Iliac Distal                     patent                       +-------------------+---------+-----------+---------+-----------+--------+       +-------------------------+---------+-----------+---------+-----------+----  ----+  EIV            RT-PatentRT-ThrombusLT-PatentLT-ThrombusComments  +-------------------------+---------+-----------+---------+-----------+----  ----+  External Iliac Vein Prox                      patent                         +-------------------------+---------+-----------+---------+-----------+----  ----+  External Iliac Vein Mid                       patent                         +-------------------------+---------+-----------+---------+-----------+----  ----+  External Iliac Vein                           patent                         Distal                                                                       +-------------------------+---------+-----------+---------+-----------+----  ----+           Summary:  IVC/Iliac: There is no evidence of thrombus involving the IVC. The left  common iliac and external iliac vein stenting appears patent.         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  At this time we will transition from Xarelto to Plavix indefinitely.  She is okay to stop Plavix for procedures.  We will see her back yearly with IVC iliac duplex.  She will continue to wear her compression stockings to her left lower extremity.     Waynetta Sandy MD [facility]

## 2021-05-31 DIAGNOSIS — M859 Disorder of bone density and structure, unspecified: Secondary | ICD-10-CM | POA: Insufficient documentation

## 2021-05-31 DIAGNOSIS — E213 Hyperparathyroidism, unspecified: Secondary | ICD-10-CM | POA: Insufficient documentation

## 2021-09-14 ENCOUNTER — Encounter: Payer: Self-pay | Admitting: Internal Medicine

## 2021-09-19 ENCOUNTER — Encounter: Payer: Self-pay | Admitting: Vascular Surgery

## 2021-09-25 ENCOUNTER — Encounter: Payer: Self-pay | Admitting: Internal Medicine

## 2021-09-25 ENCOUNTER — Telehealth: Payer: Self-pay | Admitting: *Deleted

## 2021-09-25 ENCOUNTER — Ambulatory Visit (INDEPENDENT_AMBULATORY_CARE_PROVIDER_SITE_OTHER): Payer: BC Managed Care – PPO | Admitting: Internal Medicine

## 2021-09-25 VITALS — BP 126/72 | HR 84 | Ht 66.0 in | Wt 228.0 lb

## 2021-09-25 DIAGNOSIS — M859 Disorder of bone density and structure, unspecified: Secondary | ICD-10-CM

## 2021-09-25 DIAGNOSIS — E213 Hyperparathyroidism, unspecified: Secondary | ICD-10-CM

## 2021-09-25 NOTE — Progress Notes (Signed)
Name: Monica HailJolinda J Mcguinness  MRN/ DOB: [id number], Dec 20, 1965    Age/ Sex: 56 y.o., female    PCP: Verl Bangsadiontchenko, Alexei, MD   Reason for Endocrinology Evaluation: Hypercalcemia      Date of Initial Endocrinology Evaluation: 05/29/2021    HPI: [name] is a 56 y.o. female with a past medical history of Dyslipidemia, Depression and anxiety . The patient presented for initial endocrinology clinic visit on 05/29/2021 for consultative assistance with her Hypercalcemia .     Ms. Tanja PortBabcock indicates that she was diagnosed with hypercalcemia in 03/2021 with a serum calcium of 11.0 mg/dL , albumin 4.8 g/dL. Since that time, she denies  experienced symptoms of constipation, polyuria, polydipsia. She takes  use of over the counter calcium (MVI and every other day calcium ), lithium, or HCTZ.     She is on vitamin D 2000 iu daily   She denies  history of kidney stones, kidney disease, or granulomatous disease.She has elevated LFT's, was though fatty liver , after losing 15 lbs her lFT's improved .  She has osteoporosis but DXA 2021 showed low bone density she  had a broken arm at age 89 and toes as an adult. Daily dietary calcium intake: 12 servings . She denies family history of osteoporosis, parathyroid disease.   Historically she is lactose intolerant but has been on low dairy   Questionable FH of thyroid disease in mother   24-hour urine calcium excretion 140 mg/DL  SUBJECTIVE:    Today (09/25/21):  Monica HailJolinda J Hinchliffe is here for a follow up on hypercalcemia secondary to hyperparathyroidism.    She has stopped OTC calcium tablets  She stays hydrated  Denies renal stones  Denies polydipsia nor frequency  Denies local neck symptoms  She consumes 2-3  servings of calcium    Vitamin D 2000 iu daily     HISTORY:  Past Medical History:  Past Medical History:  Diagnosis Date   Anxiety    DVT of lower extremity (deep venous thrombosis) (HCC)    Hyperlipemia     Peripheral arterial disease (HCC)    PONV (postoperative nausea and vomiting)    Past Surgical History:  Past Surgical History:  Procedure Laterality Date   APPENDECTOMY     BUNIONECTOMY Right 07/20/2020   Procedure: Excision right bunionette;  Surgeon: Toni ArthursHewitt, John, MD;  Location: [facility];  Service: Orthopedics;  Laterality: Right;  Fifth toe   LOWER EXTREMITY VENOGRAPHY Left 08/28/2020   Procedure: LOWER EXTREMITY VENOGRAPHY;  Surgeon: Maeola Harmanain, Brandon Christopher, MD;  [facility];  Service: Cardiovascular;  Laterality: Left;   ORIF FOREARM FRACTURE     PERIPHERAL VASCULAR INTERVENTION Left 08/28/2020   Procedure: PERIPHERAL VASCULAR INTERVENTION;  Surgeon: Maeola Harmanain, Brandon Christopher, MD;  [facility];  Service: Cardiovascular;  Laterality: Left;  common iliac    Social History:  reports that she has never smoked. She has never used smokeless tobacco. She reports current alcohol use. She reports that she does not use drugs. Family History: family history is not on file.   HOME MEDICATIONS: Allergies as of 09/25/2021       Reactions   Erythromycin Hives   Lactose Diarrhea   Aspirin Other (See Comments)   bleeding   Atovaquone-proguanil Hcl Nausea Only   Other Hives   Surgical glue   Penicillins Hives, Rash   Reaction: Childhood   Azithromycin Hives, Itching   Erythromycin Base Hives, Rash  Morphine And Related Hives, Rash   Sulfa Antibiotics Hives, Rash      Sulfamethoxazole-trimethoprim Hives, Rash           Medication List        Accurate as of September 25, 2021  2:56 PM. If you have any questions, ask your nurse or doctor.          STOP taking these medications    benzonatate 100 MG capsule Commonly known as: TESSALON Stopped by: Scarlette Shorts, MD       TAKE these medications    CALCIUM PO Take 1 tablet by mouth daily.   desvenlafaxine 100 MG 24 hr tablet Commonly known as: PRISTIQ Take  100 mg by mouth at bedtime.   DULoxetine 20 MG capsule Commonly known as: CYMBALTA Take 20 mg by mouth daily.   lovastatin 20 MG tablet Commonly known as: MEVACOR Take 20 mg by mouth at bedtime.   MULTI-VITAMIN DAILY PO Take 1 tablet by mouth daily.   Vasculera Tabs Take 630 mg by mouth at bedtime.   Vitamin D3 Super Strength 50 MCG (2000 UT) Tabs Generic drug: Cholecalciferol Take 2,000 Units by mouth daily.   Xarelto 10 MG Tabs tablet Generic drug: rivaroxaban TAKE 1 TABLET BY MOUTH DAILY          REVIEW OF SYSTEMS: A comprehensive ROS was conducted with the patient and is negative except as per HPI     OBJECTIVE:  VS: BP 126/72 (BP Location: Left Arm, Patient Position: Sitting, Cuff Size: Small)    Pulse 84    Ht 5\' 6"  (1.676 m)    Wt 228 lb (103.4 kg)    LMP 09/03/2011    SpO2 98%    BMI 36.80 kg/m    Wt Readings from Last 3 Encounters:  09/25/21 228 lb (103.4 kg)  05/30/21 219 lb (99.3 kg)  05/29/21 220 lb (99.8 kg)     EXAM: General: Pt appears well and is in NAD  Neck: General: Supple without adenopathy. Thyroid: Thyroid size normal.  No goiter or nodules appreciated.   Lungs: Clear with good BS bilat with no rales, rhonchi, or wheezes  Heart: Auscultation: RRR.  Abdomen: Normoactive bowel sounds, soft, nontender, without masses or organomegaly palpable  Extremities:  BL LE: No pretibial edema normal ROM and strength.  Mental Status: Judgment, insight: Intact Orientation: Oriented to time, place, and person Mood and affect: No depression, anxiety, or agitation     DATA REVIEWED:  Latest Reference Range & Units 09/25/21 15:18  Sodium 135 - 145 mEq/L 138  Potassium 3.5 - 5.1 mEq/L 4.2  Chloride 96 - 112 mEq/L 102  CO2 19 - 32 mEq/L 28  Glucose 70 - 99 mg/dL 97  BUN 6 - 23 mg/dL 16  Creatinine 09/27/21 - 9.62 mg/dL 8.36  Calcium 8.4 - 6.29 mg/dL 47.6  Albumin 3.5 - 5.2 g/dL 4.6  GFR 54.6 mL/min 73.10  VITD 30.00 - 100.00 ng/mL 42.64     Latest Reference Range & Units 09/25/21 15:18  PTH, Intact 16 - 77 pg/mL 41  TSH 0.35 - 5.50 uIU/mL 1.98  T4,Free(Direct) 0.60 - 1.60 ng/dL 09/27/21     DXA 4.65  Ap spine T -score -1.8   Z- score 2.3 RFN                      -2.1                 -1.9  ASSESSMENT/PLAN/RECOMMENDATIONS:   Primary hyperparathyroidism:    - Pt asymptomatic  -Repeat PTH and serum calcium are normal -24-hour urine excretion of calcium was normal at 140 mg/DL, urine creatinine -pending -Up-to-date on bone density showing low bone density but no osteoporosis    Recommendations  - Stay Hydrated  - Continue Vitamin D 2000 iu daily  - Maintain 2-3 servings of dietary calcium daily       2. Low Bone density :  -  No prior anti-resorptive treatment  - Maintain 2-3 servings of dietary calcium  - Continue Vitamin D 2000 iu daily  - Will repeat DXA by 06/2022  F/U in 6 months   Signed electronically by: Lyndle Herrlich, MD  [facility] Endocrinology  [facility] [address] [place] [place], [place] [address] Phone: [phone] FAX: [phone]   CC: Verl Bangs, MD [address] [place] [address] Phone: [phone] Fax: [phone]   Return to Endocrinology clinic as below: Future Appointments  Date Time Provider Department Center  09/25/2021  3:40 PM Shante Archambeault, Konrad Dolores, MD [id number]

## 2021-09-25 NOTE — Patient Instructions (Signed)
-   Stay Hydrated  - Continue Vitamin D 2000 iu daily  - Maintain 2-3 servings of dietary calcium daily  - Avoid over the counter calcium tablets   

## 2021-09-25 NOTE — Telephone Encounter (Signed)
Pt returned 24 hr urine today.  Total Vol:  Start:  09/24/21 8:45am End:  09/25/21  8:45am

## 2021-09-26 LAB — BASIC METABOLIC PANEL
BUN: 16 mg/dL (ref 6–23)
CO2: 28 mEq/L (ref 19–32)
Calcium: 10.4 mg/dL (ref 8.4–10.5)
Chloride: 102 mEq/L (ref 96–112)
Creatinine, Ser: 0.89 mg/dL (ref 0.40–1.20)
GFR: 73.1 mL/min (ref >60.00)
Glucose, Bld: 97 mg/dL (ref 70–99)
Potassium: 4.2 mEq/L (ref 3.5–5.1)
Sodium: 138 mEq/L (ref 135–145)

## 2021-09-26 LAB — CALCIUM, URINE, 24 HOUR: Calcium, 24H Urine: 140 mg/24 h

## 2021-09-26 LAB — CALCIUM, IONIZED: Calcium, Ion: 5.48 mg/dL (ref 4.8–5.6)

## 2021-09-26 LAB — VITAMIN D 25 HYDROXY (VIT D DEFICIENCY, FRACTURES): VITD: 42.64 ng/mL (ref 30.00–100.00)

## 2021-09-26 LAB — PARATHYROID HORMONE, INTACT (NO CA): PTH: 41 pg/mL (ref 16–77)

## 2021-09-26 LAB — T4, FREE: Free T4: 0.81 ng/dL (ref 0.60–1.60)

## 2021-09-26 LAB — ALBUMIN: Albumin: 4.6 g/dL (ref 3.5–5.2)

## 2021-09-26 LAB — TSH: TSH: 1.98 u[IU]/mL (ref 0.35–5.50)

## 2021-09-27 LAB — CREATININE, URINE, 24 HOUR: Creatinine, 24H Ur: 1.38 g/(24.h) (ref 0.50–2.15)

## 2021-11-29 ENCOUNTER — Encounter: Payer: Self-pay | Admitting: Psychology

## 2022-02-13 ENCOUNTER — Encounter: Payer: BC Managed Care – PPO | Attending: Psychology | Admitting: Psychology

## 2022-02-13 DIAGNOSIS — F419 Anxiety disorder, unspecified: Secondary | ICD-10-CM | POA: Diagnosis not present

## 2022-02-13 DIAGNOSIS — R413 Other amnesia: Secondary | ICD-10-CM | POA: Diagnosis present

## 2022-03-15 ENCOUNTER — Ambulatory Visit (INDEPENDENT_AMBULATORY_CARE_PROVIDER_SITE_OTHER): Payer: BC Managed Care – PPO | Admitting: Behavioral Health

## 2022-03-15 ENCOUNTER — Encounter: Payer: Self-pay | Admitting: Behavioral Health

## 2022-03-15 VITALS — BP 145/83 | HR 94 | Ht 66.0 in | Wt 223.0 lb

## 2022-03-15 DIAGNOSIS — F331 Major depressive disorder, recurrent, moderate: Secondary | ICD-10-CM | POA: Diagnosis not present

## 2022-03-15 DIAGNOSIS — F411 Generalized anxiety disorder: Secondary | ICD-10-CM

## 2022-03-15 DIAGNOSIS — R454 Irritability and anger: Secondary | ICD-10-CM | POA: Diagnosis not present

## 2022-03-15 MED ORDER — LAMOTRIGINE 25 MG PO TABS: ORAL_TABLET | ORAL | 1 refills | Status: DC

## 2022-03-15 MED ORDER — DESVENLAFAXINE SUCCINATE ER 50 MG PO TB24: 50.0000 mg | ORAL_TABLET | Freq: Every day | ORAL | 1 refills | Status: DC
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
## 2022-03-19 ENCOUNTER — Encounter: Payer: Self-pay | Admitting: Psychology

## 2022-03-19 NOTE — Progress Notes (Signed)
Neuropsychological Consultation   Patient:   Monica Perez   DOB:   08-21-1966  MR Number:  [id number]  Location:  [facility] FOR PAIN AND [facility] MEDICINE [facility] PHYSICAL MEDICINE AND REHABILITATION [address] [place], [address] [id number] [facility] [place] [address] Dept: [phone]           Date of Service:   02/13/2022  Start Time:   1 PM End Time:   3 PM  Today's visit was an in person visit as conducted in my outpatient clinic office.  The patient myself were present.  1 hour and 15 minutes was spent in face-to-face clinical interview and the other 45 minutes was spent with records review, report writing and setting up testing protocols.  Provider/Observer:  Arley Phenix, Psy.D.       Clinical Neuropsychologist       Billing Code/Service: [id number]  Chief Complaint:    Monica Perez is a 56 year old female referred for neuropsychological evaluation due to ongoing changes in memory, expressive fluency and word finding and persistent fatigue following/developing post COVID-19 infection in September 2022.  The patient has been followed by Claretta Fraise, MD with [facility] neurology and has been diagnosed with mild sleep apnea and has been presenting with complaints of "brain fog" and other cognitive difficulties post-COVID.  The patient did have a previous neuropsychological evaluation conducted on 11/15/2020 that can be found in her EMR.  However, the patient had concerns about how this neuropsychological assessment was interpreted and she did not get adequate answers to her questions regarding her cognitive functioning.  Questions about the validity of her effort etc. were made and the patient pointed out that this evaluation is for her needs and that there are no clear secondary gains are justification for attempts to exaggerate or Frain any type of illness or difficulty.  This assessment is to follow-up and provide a reassessment to address some of the  specific questions that the patient has.  Reason for Service:  Monica Perez is a 56 year old female referred for neuropsychological evaluation due to ongoing changes in memory, expressive fluency and word finding and persistent fatigue following/developing post COVID-19 infection in September 2022.  The patient has been followed by Claretta Fraise, MD with [facility] neurology and has been diagnosed with mild sleep apnea and has been presenting with complaints of "brain fog" and other cognitive difficulties post-COVID.  The patient did have a previous neuropsychological evaluation conducted on 11/15/2020 that can be found in her EMR.  However, the patient had concerns about how this neuropsychological assessment was interpreted and she did not get adequate answers to her questions regarding her cognitive functioning.  Questions about the validity of her effort etc. were made and the patient pointed out that this evaluation is for her needs and that there are no clear secondary gains are justification for attempts to exaggerate or Frain any type of illness or difficulty.  This assessment is to follow-up and provide a reassessment to address some of the specific questions that the patient has.  She was initially referred for neuropsychological evaluation within [facility] due to memory loss with this being completed on 11/15/2020.  As part of the neuropsychological evaluation the patient was given a number of validity measures and estimates of effort and persistent.  The patient performed poorly on these measures and they were interpreted as suggestive of invalidating the test battery overall and that the current scores produced under this battery were not considered interpretable/valid.  It  was further opined that the patient's subjective cognitive complaints are secondary to significant symptoms of anxiety/depression.  Was also felt that the derived pattern of patient's performance on measures of memory  were not concerning for a neurodegenerative disorder.  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  She describes difficulty with word finding and fatigue.  Patient reports that she often cannot find the word that she wants to think of and other word finding difficulties.  The patient has been followed by pulmonology and neurology and has been diagnosed with mild sleep apnea which has been considered to be potentially playing a part/role in her symptoms.  The patient reports that over the past 6 months that her memory has improved some but she is still very fatigued and the fatigue has not changed.  Patient reports that while she has continued with memory difficulties they have improved somewhat.  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  She reports that she cut back to part-time and just feels tired and sleepy all day long.  The patient describes her current ongoing difficulties of being tired, fatigued, slightly depressed, forgetting people's names, word finding difficulties, and difficulty retrieving factual information.  The patient describes her current sleep pattern is getting 8 hours of sleep during the week and 12 hours on the weekend.  She reports that she naps for 2 to 3 hours several times a week during the day.  The patient reports that there has been some increase stress response due to fatigue.  She reports that she can become verbally outspoken and yelling when she gets agitated and that now if she is overly worried or faces major stressors that they can create conflicts and difficulties between the patient and her husband.  The patient describes developing COVID while on a cruise ship in [place].  She reports that about a week after symptoms develop she returned home and went to the emergency  department.  Patient reports that her COVID symptoms lasted for several weeks and afterwards she never regained her previous energy levels.  The patient reports that she always had some degree of fatigue and tiredness but at post COVID that she has been much more fatigued.  The patient is more recently been diagnosed with mild obstructive sleep apnea.  Behavioral Observation: SANAAI DOANE  presents as a 56 y.o.-year-old Right handed Female who appeared her stated age. her dress was Appropriate and she was Well Groomed and her manners were Appropriate to the situation.  her participation was indicative of Appropriate behaviors.  There were not physical disabilities noted.  she displayed an appropriate level of cooperation and motivation.     Interactions:    Active Appropriate  Attention:   abnormal and attention span appeared shorter than expected for age  Memory:   within normal limits; recent and remote memory intact  Visuo-spatial:  not examined  Speech (Volume):  normal  Speech:   normal; some possible word finding issues were noted during the clinical interview.  Thought Process:  Coherent and Relevant  Though Content:  WNL; not suicidal and not homicidal  Orientation:   person, place, time/date, and situation  Judgment:   Good  Planning:   Fair  Affect:    Appropriate  Mood:    Anxious  Insight:   Good  Intelligence:  very high  Marital Status/Living: The patient was born in [place] with 1 brother who is now deceased.  No issues were noted with the patient's mother's pregnancy and delivery was uncomplicated.  The patient reports that as a child she had pneumonia several times and wore leg braces and slept on a straight board for her leg issues at times.  The patient currently lives with her husband of 24 years and has no children.  She was married 1 time previously for 3 months.  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  She has had this business for 22 years.  She has never been terminated from any jobs.  Hobbies and interest include travel, spending times with her dogs, painting, yoga and reading.  Substance Use:  No concerns of substance abuse are reported.  The patient describes occasional social drinking and no other substance use.  Education:   The patient has completed law school after college and always maintain a very good GPA (3.8).  The patient always did well in English type subjects and had no academic subjects where she had difficulty in.  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  The patient had multiple honors/awards through college and law school graduating summa cum laude with a full scholarship.  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  Medical History:   Past Medical History:  Diagnosis Date   Anxiety    DVT of lower extremity (deep venous thrombosis) (HCC)    Hyperlipemia    Peripheral arterial disease (HCC)    PONV (postoperative nausea and vomiting)          Patient Active Problem List   Diagnosis Date Noted   Low bone density 05/31/2021   Hyperparathyroidism (HCC) 05/31/2021   Hypercalcemia 05/31/2021   Abnormal cervical Papanicolaou smear 09/19/2020   Candidiasis of vagina 09/19/2020   History of depression 09/19/2020   Encounter for orthopedic follow-up care 09/04/2020   Encounter for postoperative care 09/04/2020   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   Tailor's bunion of right foot 06/28/2020   Anxiety 03/15/2020   Pain in joint of left shoulder 02/02/2020   Colonoscopy refused 08/19/2019   Severe obesity (BMI 35.0-39.9) with comorbidity (HCC) 06/23/2019   Localized osteoporosis without current pathological fracture 04/09/2018   Mononeuropathy 10/06/2017   Obesity (BMI 35.0-39.9 without comorbidity)  10/06/2017   Anticoagulation adequate 06/25/2017   DVT (deep venous thrombosis) (HCC) 06/19/2017   Post-thrombotic syndrome of left lower extremity 06/19/2017   Acute deep vein thrombosis (DVT) of left lower extremity (HCC) 06/17/2017   History of deep venous thrombosis (DVT) of distal vein of left lower extremity 06/17/2017   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   Drusen of both optic discs 06/01/2015   Left epiretinal membrane 06/01/2015   Dyslipidemia 05/05/2015   Posterior vitreous detachment of both eyes 04/27/2015   Chronic idiopathic urticaria 04/21/2014   Penicillin allergy 04/21/2014   Urticaria 04/01/2014   Vitamin D deficiency 04/01/2014   Allergic rhinosinusitis 07/30/2013   S/P LASIK surgery 10/17/2012   Left hip pain 08/20/2012   Myopia 07/27/2012   Elevated triglycerides with high cholesterol 09/02/1998        Psychiatric History:  The patient does have a history of anxiety and stress response as well as some significant chronic pain issues.  The patient has been  taking Pristiq for some time now and also takes Cymbalta for her anxiety and stress responses.  Family Med/Psych History:  Family History  Problem Relation Age of Onset   Anxiety disorder Brother    Depression Brother    Bipolar disorder Brother    Suicidality Brother    Alcohol abuse Maternal Aunt    Alcohol abuse Paternal Aunt    Suicidality Maternal Grandmother     Impression/DX:  ETANA BEETS is a 56 year old female referred for neuropsychological evaluation due to ongoing changes in memory, expressive fluency and word finding and persistent fatigue following/developing post COVID-19 infection in September 2022.  The patient has been followed by Claretta Fraise, MD with [facility] neurology and has been diagnosed with mild sleep apnea and has been presenting with complaints of "brain fog" and other cognitive difficulties post-COVID.  The patient did have a previous neuropsychological  evaluation conducted on 11/15/2020 that can be found in her EMR.  However, the patient had concerns about how this neuropsychological assessment was interpreted and she did not get adequate answers to her questions regarding her cognitive functioning.  Questions about the validity of her effort etc. were made and the patient pointed out that this evaluation is for her needs and that there are no clear secondary gains are justification for attempts to exaggerate or Frain any type of illness or difficulty.  This assessment is to follow-up and provide a reassessment to address some of the specific questions that the patient has.  Disposition/Plan:  As the patient has had a previous neuropsychological evaluation very recently we will adjust some so we do not repeat most of the measures that were administered.  We will administer a very standard structured battery including the Wechsler Adult Intelligence Scale's and the Wechsler Memory Scale's along with some other neuropsychological measures including measures of expressive language and verbal fluency.  Once this is completed a formal report will be produced with feedback to the patient.  Any recommendations and diagnostic considerations will be directly communicated to the patient and made available in her EMR.  Diagnosis:    Memory changes  Anxiety         Electronically Signed   _______________________ Arley Phenix, Psy.D. Clinical Neuropsychologist

## 2022-03-21 ENCOUNTER — Encounter: Payer: BC Managed Care – PPO | Attending: Psychology

## 2022-03-21 DIAGNOSIS — R413 Other amnesia: Secondary | ICD-10-CM | POA: Insufficient documentation

## 2022-03-21 NOTE — Progress Notes (Signed)
Behavioral Observations  The patient appeared well-groomed and appropriately dressed. Her manners were polite and appropriate to the situation. The patient demonstrated a positive attitude toward testing and showed good effort. Some word finding difficulty was observed. The patient became frustrated by the [facility] [employer] subtest but continued to comply with instructions and show good effort.    Neuropsychology Note  Monica Perez completed 180 minutes of neuropsychological testing with technician, Marica Otter, BA, under the supervision of Arley Phenix, PsyD., Clinical Neuropsychologist. The patient did not appear overtly distressed by the testing session, per behavioral observation or via self-report to the technician. Rest breaks were offered.   Clinical Decision Making: In considering the patient's current level of functioning, level of presumed impairment, nature of symptoms, emotional and behavioral responses during clinical interview, level of literacy, and observed level of motivation/effort, a battery of tests was selected by Dr. Kieth Brightly during initial consultation on 02/13/2022. This was communicated to the technician. Communication between the neuropsychologist and technician was ongoing throughout the testing session and changes were made as deemed necessary based on patient performance on testing, technician observations and additional pertinent factors such as those listed above.  Tests Administered: Controlled Oral Word Association Test (COWAT; FAS & Animals)  Wechsler Adult Intelligence Scale, 4th Edition (WAIS-IV) Wechsler Memory Scale, 4th Edition (WMS-IV); Adult Battery    Results:  COWAT FAS Total = 54 Z = 0.83 Animals Total = 33 Z = 2.05      WAIS-IV  Composite Score Summary  Scale Sum of Scaled Scores Composite Score Percentile Rank 95% Conf. Interval Qualitative Description  Verbal Comprehension 33 VCI 105 63 99-110 Average  Perceptual  Reasoning 39 PRI 117 87 110-122 High Average  Working Memory 18 WMI 95 37 89-102 Average  Processing Speed 22 PSI 105 63 96-113 Average  Full Scale 112 FSIQ 108 70 104-112 Average  General Ability 72 GAI 112 79 107-117 High Average      Verbal Comprehension Subtests Summary  Subtest Raw Score Scaled Score Percentile Rank Reference Group Scaled Score SEM  Similarities 28 11 63 11 1.08  Vocabulary 43 11 63 12 0.73  Information 16 11 63 12 0.67  (Comprehension) 35 18 99.6 18 1.08       Perceptual Reasoning Subtests Summary  Subtest Raw Score Scaled Score Percentile Rank Reference Group Scaled Score SEM  Block Design 45 12 75 10 1.04  Matrix Reasoning 22 15 95 13 0.95  Visual Puzzles 15 12 75 10 0.99  (Figure Weights) 14 11 63 9 0.99  (Picture Completion) 15 12 75 11 1.12       Working Librarian, academic Raw Score Scaled Score Percentile Rank Reference Group Scaled Score SEM  Digit Span 25 9 37 8 0.85  Arithmetic 13 9 37 9 1.04  (Letter-Number Seq.) 19 9 37 9 1.08       Processing Speed Subtests Summary  Subtest Raw Score Scaled Score Percentile Rank Reference Group Scaled Score SEM  Symbol Search 34 12 75 10 1.31  Coding 58 10 50 7 0.99  (Cancellation) 36 10 50 8 1.34       WMS-IV  Index Score Summary  Index Sum of Scaled Scores Index Score Percentile Rank 95% Confidence Interval Qualitative Descriptor  Auditory Memory (AMI) 34 91 27 85-98 Average  Visual Memory (VMI) 44 105 63 99-110 Average  Visual Working Memory (VWMI) 24 112 79 104-118 High Average  Immediate Memory (IMI) 38 96 39 90-102 Average  Delayed Memory (DMI) 40 100 50 93-107 Average      Primary Subtest Scaled Score Summary  Subtest Domain Raw Score Scaled Score Percentile Rank  Logical Memory I AM 23 9 37  Logical Memory II AM 21 10 50  [facility] I AM 20 7 16   [facility] II AM 7 8 25   Designs I VM 73 12 75  Designs II VM 71 14 91  Visual  Reproduction I VM 34 10 50  Visual Reproduction II VM 15 8 25   Spatial Addition VWM 16 13 84  Symbol Span VWM 24 11 63      Auditory Memory Process Score Summary  Process Score Raw Score Scaled Score Percentile Rank Cumulative Percentage (Base Rate)  LM II Recognition 23 - - 26-50%  VPA II Recognition 37 - - 26-50%       Visual Memory Process Score Summary  Process Score Raw Score Scaled Score Percentile Rank Cumulative Percentage (Base Rate)  DE I Content 39 13 84 -  DE I Spatial 16 10 50 -  DE II Content 42 16 98 -  DE II Spatial 15 13 84 -  DE II Recognition 16 - - 51-75%  VR II Recognition 4 - - 17-25%       ABILITY-MEMORY ANALYSIS  Ability Score:  GAI: 112 Date of Testing:  WAIS-IV; WMS-IV 2022/03/21  Predicted Difference Method   Index Predicted WMS-IV Index Score Actual WMS-IV Index Score Difference Critical Value  Significant Difference Y/N Base Rate  Auditory Memory 106 91 15 8.95 Y 10-15%  Visual Memory 107 105 2 8.82 N   Visual Working Memory 108 112 -4 11.24 N   Immediate Memory 108 96 12 10.35 Y 15%  Delayed Memory 107 100 7 10.08 N   Statistical significance (critical value) at the .01 level.        Feedback to Patient: Monica Perez will return on 08/28/2022 for an interactive feedback session with Dr. 2022/03/23 at which time her test performances, clinical impressions and treatment recommendations will be reviewed in detail. The patient understands she can contact our office should she require our assistance before this time.  180 minutes spent face-to-face with patient administering standardized tests, 30 minutes spent scoring Monica Perez). [CPT 08/30/2022, 96139]  Full report to follow.

## 2022-03-26 ENCOUNTER — Encounter: Payer: Self-pay | Admitting: Internal Medicine

## 2022-03-26 ENCOUNTER — Ambulatory Visit (INDEPENDENT_AMBULATORY_CARE_PROVIDER_SITE_OTHER): Payer: BC Managed Care – PPO | Admitting: Internal Medicine

## 2022-03-26 ENCOUNTER — Ambulatory Visit: Payer: BC Managed Care – PPO | Admitting: Internal Medicine

## 2022-03-26 VITALS — BP 142/90 | HR 90 | Ht 66.0 in | Wt 224.6 lb

## 2022-03-26 DIAGNOSIS — M859 Disorder of bone density and structure, unspecified: Secondary | ICD-10-CM | POA: Diagnosis not present

## 2022-03-26 DIAGNOSIS — E213 Hyperparathyroidism, unspecified: Secondary | ICD-10-CM

## 2022-03-26 LAB — VITAMIN D 25 HYDROXY (VIT D DEFICIENCY, FRACTURES): VITD: 47.17 ng/mL (ref 30.00–100.00)

## 2022-03-26 LAB — BASIC METABOLIC PANEL
BUN: 13 mg/dL (ref 6–23)
CO2: 29 mEq/L (ref 19–32)
Calcium: 10.2 mg/dL (ref 8.4–10.5)
Chloride: 103 mEq/L (ref 96–112)
Creatinine, Ser: 0.92 mg/dL (ref 0.40–1.20)
GFR: 70 mL/min (ref >60.00)
Glucose, Bld: 121 mg/dL — ABNORMAL HIGH (ref 70–99)
Potassium: 4.2 mEq/L (ref 3.5–5.1)
Sodium: 140 mEq/L (ref 135–145)

## 2022-03-26 LAB — ALBUMIN: Albumin: 4.6 g/dL (ref 3.5–5.2)

## 2022-03-26 NOTE — Progress Notes (Unsigned)
Name: Monica Perez  MRN/ DOB: [id number], [birth year]    Age/ Sex: 56 y.o., female    PCP: Verl Bangs, MD   Reason for Endocrinology Evaluation: Hypercalcemia      Date of Initial Endocrinology Evaluation: 05/29/2021    HPI: [name] is a 56 y.o. female with a past medical history of Dyslipidemia, Depression and anxiety . The patient presented for initial endocrinology clinic visit on 05/29/2021 for consultative assistance with her Hypercalcemia .     [name] indicates that she was diagnosed with hypercalcemia in 03/2021 with a serum calcium of 11.0 mg/dL , albumin 4.8 g/dL. Since that time, she denies  experienced symptoms of constipation, polyuria, polydipsia. She takes  use of over the counter calcium (MVI and every other day calcium ), lithium, or HCTZ.     She denies  history of kidney stones, kidney disease, or granulomatous disease.She has elevated LFT's, was though fatty liver , after losing 15 lbs her lFT's improved .  She has osteoporosis but DXA 2021 showed low bone density she  had a broken arm at age 35 and toes as an adult.   She denies family history of osteoporosis, parathyroid disease.   Questionable FH of thyroid disease in mother   24-hour urine calcium excretion 140 mg/DL  SUBJECTIVE:    Today (03/26/22):  Monica Perez is here for a follow up on hypercalcemia secondary to hyperparathyroidism.   She stays hydrated  Denies renal stones  Stable polydipsia with stable frequency  Denies constipation  She consumes 2-3  servings of calcium    Vitamin D 2000 iu daily     HISTORY:  Past Medical History:  Past Medical History:  Diagnosis Date   Anxiety    DVT of lower extremity (deep venous thrombosis) (HCC)    Hyperlipemia    Peripheral arterial disease (HCC)    PONV (postoperative nausea and vomiting)    Past Surgical History:  Past Surgical History:  Procedure Laterality Date   APPENDECTOMY     BUNIONECTOMY  Right 07/20/2020   Procedure: Excision right bunionette;  Surgeon: Toni Arthurs, MD;  Location: [facility];  Service: Orthopedics;  Laterality: Right;  Fifth toe   LOWER EXTREMITY VENOGRAPHY Left 08/28/2020   Procedure: LOWER EXTREMITY VENOGRAPHY;  Surgeon: Maeola Harman, MD;  Location: [facility] INVASIVE CV LAB;  Service: Cardiovascular;  Laterality: Left;   ORIF FOREARM FRACTURE     PERIPHERAL VASCULAR INTERVENTION Left 08/28/2020   Procedure: PERIPHERAL VASCULAR INTERVENTION;  Surgeon: Maeola Harman, MD;  Location: [facility] INVASIVE CV LAB;  Service: Cardiovascular;  Laterality: Left;  common iliac    Social History:  reports that she has never smoked. She has never used smokeless tobacco. She reports current alcohol use. She reports that she does not use drugs. Family History: family history includes Alcohol abuse in her maternal aunt and paternal aunt; Anxiety disorder in her brother; Bipolar disorder in her brother; Depression in her brother; Suicidality in her brother and maternal grandmother.   HOME MEDICATIONS: Allergies as of 03/26/2022       Reactions   Erythromycin Hives   Lactose Diarrhea   Aspirin Other (See Comments)   bleeding   Atovaquone-proguanil Hcl Nausea Only   Other Hives   Surgical glue   Penicillins Hives, Rash   Reaction: Childhood   Erythromycin Base Hives, Rash      Morphine And Related Hives, Rash   Sulfa Antibiotics Hives, Rash  Sulfamethoxazole-trimethoprim Hives, Rash           Medication List        Accurate as of March 26, 2022 11:36 AM. If you have any questions, ask your nurse or doctor.          STOP taking these medications    CALCIUM PO Stopped by: Monica Sciara, MD   DULoxetine 20 MG capsule Commonly known as: CYMBALTA Stopped by: Monica Sciara, MD   lovastatin 20 MG tablet Commonly known as: MEVACOR Stopped by: Monica Sciara, MD   Vasculera Tabs Stopped by: Monica Sciara, MD   Xarelto 10 MG Tabs tablet Generic drug: rivaroxaban Stopped by: Monica Sciara, MD       TAKE these medications    atorvastatin 40 MG tablet Commonly known as: LIPITOR Take 40 mg by mouth daily.   clopidogrel 75 MG tablet Commonly known as: PLAVIX   clotrimazole 10 MG troche Commonly known as: MYCELEX Take by mouth.   desvenlafaxine 100 MG 24 hr tablet Commonly known as: PRISTIQ Take 100 mg by mouth at bedtime.   desvenlafaxine 50 MG 24 hr tablet Commonly known as: Pristiq Take 1 tablet (50 mg total) by mouth daily.   lamoTRIgine 25 MG tablet Commonly known as: LaMICtal Take one tablet daily for 14 days, then take two tablets 50 mg total daily.   LORazepam 0.5 MG tablet Commonly known as: ATIVAN   MULTI-VITAMIN DAILY PO Take 1 tablet by mouth daily.   Vitamin D3 Super Strength 50 MCG (2000 UT) Tabs Generic drug: Cholecalciferol Take 2,000 Units by mouth daily.          REVIEW OF SYSTEMS: A comprehensive ROS was conducted with the patient and is negative except as per HPI     OBJECTIVE:  VS: BP (!) 142/90 (BP Location: Left Arm, Patient Position: Sitting, Cuff Size: Normal)   Pulse 90   Ht 5\' 6"  (1.676 m)   Wt 224 lb 9.6 oz (101.9 kg)   LMP 09/03/2011   SpO2 95%   BMI 36.25 kg/m    Wt Readings from Last 3 Encounters:  03/26/22 224 lb 9.6 oz (101.9 kg)  09/25/21 228 lb (103.4 kg)  05/30/21 219 lb (99.3 kg)     EXAM: General: Pt appears well and is in NAD  Neck: General: Supple without adenopathy. Thyroid: Thyroid size normal.  No goiter or nodules appreciated.   Lungs: Clear with good BS bilat with no rales, rhonchi, or wheezes  Heart: Auscultation: RRR.  Abdomen: Normoactive bowel sounds, soft, nontender, without masses or organomegaly palpable  Extremities:  BL LE: No pretibial edema normal ROM and strength.  Mental Status: Judgment, insight: Intact Orientation: Oriented to time, place, and person Mood and  affect: No depression, anxiety, or agitation     DATA REVIEWED:  Latest Reference Range & Units 03/26/22 11:51  Sodium 135 - 145 mEq/L 140  Potassium 3.5 - 5.1 mEq/L 4.2  Chloride 96 - 112 mEq/L 103  CO2 19 - 32 mEq/L 29  Glucose 70 - 99 mg/dL 121 (H)  BUN 6 - 23 mg/dL 13  Creatinine 0.40 - 1.20 mg/dL 0.92  Calcium 8.4 - 10.5 mg/dL 10.2  Albumin 3.5 - 5.2 g/dL 4.6  GFR >60.00 mL/min 70.00  VITD 30.00 - 100.00 ng/mL 47.17    PTH-pending     DXA 06/02/2020  Ap spine T -score -1.8   Z- score 2.3 RFN                      -  2.1                 -1.9    ASSESSMENT/PLAN/RECOMMENDATIONS:   Primary hyperparathyroidism:  - Pt asymptomatic  -Repeat serum calcium, vitamin  D and GFR are normal -24-hour urine excretion of calcium was normal at 140 mg/DL - DXA in 5732 showed low bone density but no osteoporosis - Pt does NOT meet surgical intervention at this time    Recommendations  - Stay Hydrated  - Continue Vitamin D 2000 iu daily  - Maintain 2-3 servings of dietary calcium daily       2. Low Bone density :  - No prior anti-resorptive treatment  - Maintain 2-3 servings of dietary calcium  - Continue Vitamin D 2000 iu daily  - She is scheduled for repeat  DXA by 06/2022 at [facility]  F/U in 6 months   Signed electronically by: Lyndle Herrlich, MD  [facility]  [facility] [address] [place] [place], [place] [address] Phone: [phone] FAX: [phone]   CC: Verl Bangs, MD [address] [place] [address] Phone: [phone] Fax: [phone]   Return to Endocrinology clinic as below: Future Appointments  Date Time Provider Department Center  04/12/2022  9:00 AM Joan Flores, NP CP-CP None  04/23/2022  8:00 AM Hershal Coria, PsyD CPR-PRMA CPR  06/12/2022  8:00 AM MC-CV HS VASC 6 - MK MC-HCVI VVS  06/12/2022  9:00 AM [facility] PA [facility] VVS  06/17/2022  4:00 PM Rodenbough, Aram Candela, PsyD CPR-PRMA  CPR

## 2022-03-26 NOTE — Patient Instructions (Signed)
-   Stay Hydrated  - Continue Vitamin D 2000 iu daily  - Maintain 2-3 servings of dietary calcium daily  - Avoid over the counter calcium tablets

## 2022-03-27 LAB — PARATHYROID HORMONE, INTACT (NO CA): PTH: 44 pg/mL (ref 16–77)

## 2022-04-12 ENCOUNTER — Ambulatory Visit (INDEPENDENT_AMBULATORY_CARE_PROVIDER_SITE_OTHER): Payer: BC Managed Care – PPO | Admitting: Behavioral Health

## 2022-04-12 ENCOUNTER — Encounter: Payer: Self-pay | Admitting: Behavioral Health

## 2022-04-12 DIAGNOSIS — F331 Major depressive disorder, recurrent, moderate: Secondary | ICD-10-CM | POA: Diagnosis not present

## 2022-04-12 DIAGNOSIS — F411 Generalized anxiety disorder: Secondary | ICD-10-CM | POA: Diagnosis not present

## 2022-04-12 DIAGNOSIS — R454 Irritability and anger: Secondary | ICD-10-CM | POA: Diagnosis not present

## 2022-04-12 MED ORDER — LAMOTRIGINE 100 MG PO TABS: 100.0000 mg | ORAL_TABLET | Freq: Every day | ORAL | 1 refills | Status: DC

## 2022-04-12 NOTE — Progress Notes (Signed)
[facility]  Patient ID: Monica Perez,  MRN: [id number]  PCP: Verl Bangs, MD  Date of Evaluation: 04/12/2022 Time spent:30 minutes  Chief Complaint:  Chief Complaint   Anxiety; Depression; Follow-up; Medication Refill; Patient Education     HISTORY/CURRENT STATUS: HPI 56 year old female presents to this office for follow up and medication management.  She  says, "I feel like the Lamictal is starting to work. I feel like I have not been so edgy".  She says her husband also notices the improvement with irritability.  She feels like she could benefit from a dosage increase with Lamictal.  Says her anxiety today is 2/10 and depression is 3/10.  She denies mania, psychosis, auditory or visual hallucinations.  No SI or HI.  Past psychotropic medications: Prozac Wellbutrin     Individual Medical History/ Review of Systems: Changes? :No   Allergies: Erythromycin, Lactose, Aspirin, Atovaquone-proguanil hcl, Other, Penicillins, Erythromycin base, Morphine and related, Sulfa antibiotics, and Sulfamethoxazole-trimethoprim  Current Medications:  Current Outpatient Medications:    atorvastatin (LIPITOR) 40 MG tablet, Take 40 mg by mouth daily., Disp: , Rfl:    Cholecalciferol (VITAMIN D3 SUPER STRENGTH) 50 MCG (2000 UT) TABS, Take 2,000 Units by mouth daily., Disp: , Rfl:    clopidogrel (PLAVIX) 75 MG tablet, , Disp: , Rfl:    clotrimazole (MYCELEX) 10 MG troche, Take by mouth., Disp: , Rfl:    desvenlafaxine (PRISTIQ) 100 MG 24 hr tablet, Take 100 mg by mouth at bedtime., Disp: , Rfl:    desvenlafaxine (PRISTIQ) 50 MG 24 hr tablet, Take 1 tablet (50 mg total) by mouth daily., Disp: 30 tablet, Rfl: 1   lamoTRIgine (LAMICTAL) 25 MG tablet, Take one tablet daily for 14 days, then take two tablets 50 mg total daily., Disp: 60 tablet, Rfl: 1   LORazepam (ATIVAN) 0.5 MG tablet, , Disp: , Rfl:    Multiple Vitamin (MULTI-VITAMIN DAILY PO), Take 1 tablet by mouth daily.,  Disp: , Rfl:   Current Facility-Administered Medications:    0.9 %  sodium chloride infusion, 250 mL, Intravenous, PRN, Nada Libman, MD   sodium chloride flush (NS) 0.9 % injection 3 mL, 3 mL, Intravenous, Q12H, Brabham, Fran Lowes, MD Medication Side Effects: none  Family Medical/ Social History: Changes? No  MENTAL HEALTH EXAM:  Last menstrual period 09/03/2011.There is no height or weight on file to calculate BMI.  General Appearance: Casual, Neat, and Well Groomed  Eye Contact:  Good  Speech:  Clear and Coherent  Volume:  Normal  Mood:  Angry  Affect:  Appropriate  Thought Process:  Coherent  Orientation:  Full (Time, Place, and Person)  Thought Content: Logical   Suicidal Thoughts:  No  Homicidal Thoughts:  No  Memory:  WNL  Judgement:  Good  Insight:  Good  Psychomotor Activity:  Normal  Concentration:  Concentration: Good  Recall:  Good  Fund of Knowledge: Good  Language: Good  Assets:  Desire for Improvement  ADL's:  Intact  Cognition: WNL  Prognosis:  Good    DIAGNOSES:    ICD-10-CM   1. Generalized anxiety disorder  F41.1     2. Major depressive disorder, recurrent episode, moderate (HCC)  F33.1     3. Irritability  R45.4       Receiving Psychotherapy: No    RECOMMENDATIONS:   Greater than 50% of  30  min face to face time with patient was spent on counseling and coordination of care. Discussed her moderate improvement with  mood, irritability and short fuse with husband. She is requesting increase of Lamictal this visit.  We agreed to: Continue Pristiq to 150 mg daily Increase  Lamictal to 100 mg daily.  Will report worsening symptoms promptly To follow up in 6 weeks to reassess Provided emergency contact information Monitor for any sign of rash. Please taking Lamictal and contact office immediately rash develops. Recommend seeking urgent medical attention if rash is severe and/or spreading quickly.  Reviewed PDMP   Joan Flores, NP

## 2022-04-12 NOTE — Addendum Note (Signed)
Addended by: Avelina Laine A on: 04/12/2022 02:50 PM   Modules accepted: Orders

## 2022-04-23 ENCOUNTER — Encounter: Payer: BC Managed Care – PPO | Attending: Psychology | Admitting: Psychology

## 2022-04-23 ENCOUNTER — Encounter: Payer: Self-pay | Admitting: Psychology

## 2022-04-23 DIAGNOSIS — R413 Other amnesia: Secondary | ICD-10-CM

## 2022-04-23 DIAGNOSIS — Z8659 Personal history of other mental and behavioral disorders: Secondary | ICD-10-CM

## 2022-04-23 DIAGNOSIS — F419 Anxiety disorder, unspecified: Secondary | ICD-10-CM | POA: Diagnosis present

## 2022-04-23 NOTE — Progress Notes (Signed)
Neuropsychological Evaluation   Patient:  Monica Perez   DOB: April 25, 1966  MR Number: [id number]  Location: [facility] [place], [place] [id number] [place] [id number] Dept: [phone]  Start: 8 AM End: 9 AM  Provider/Observer:     Edgardo Roys PsyD  Chief Complaint:      Chief Complaint  Patient presents with   Fatigue   Memory Loss   Sleeping Problem   Other    Word finding difficulties   Anxiety    Reason For Service:     Monica Perez is a 56 year old female referred for neuropsychological evaluation due to ongoing changes in memory, expressive fluency and word finding and persistent fatigue following/developing post COVID-19 infection in September 2022.  The patient has been followed by Jeannie Fend, MD with [facility] neurology and has been diagnosed with mild sleep apnea and has been presenting with complaints of "brain fog" and other cognitive difficulties post-COVID.  The patient did have a previous neuropsychological evaluation conducted on 11/15/2020 that can be found in her EMR.  However, the patient had concerns about how this neuropsychological assessment was interpreted and she did not get adequate answers to her questions regarding her cognitive functioning.  Questions about the validity of her effort etc. were made and the patient pointed out that this evaluation was for her needs and that there are no clear secondary gains or justification for attempts to exaggerate or simulate any type of illness or difficulty.  This assessment is to follow-up and provide a reassessment to address some of the specific questions that the patient has.   She was initially referred for neuropsychological evaluation within [facility] due to memory loss with this being completed on 11/15/2020.  As part of the neuropsychological evaluation, the patient was given  a number of validity measures and estimates of effort and persistent.  The patient performed poorly on these measures and they were interpreted as suggestive of invalidating the test battery overall and that the current scores produced under this battery were not considered interpretable/valid.  It was further opined that the patient's subjective cognitive complaints are secondary to significant symptoms of anxiety/depression.  Was also felt that the derived pattern of patient's performance on measures of memory were not concerning for a neurodegenerative disorder.  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  She describes difficulty with word finding and fatigue.  Patient reports that she often cannot find the word that she wants to think of and other word finding difficulties.  The patient has been followed by pulmonology and neurology and has been diagnosed with mild sleep apnea which has been considered to be potentially playing a part/role in her symptoms.  The patient reports that over the past 6 months that her memory has improved some but she is still very fatigued and the fatigue has not changed.  Patient reports that while she has continued with memory difficulties they have improved somewhat.  The patient has also dealt with anxiety symptoms and diagnoses with a generalized anxiety disorder.  She has stated taking Lamictal and reports on her most recent psychiatric appointment that she is responding well and anxiety/depression have improved with this intervention.  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  She reports that she cut back to part-time and just feels tired and sleepy all day long.  The patient describes her current ongoing difficulties of being tired, fatigued, slightly depressed, forgetting  people's names, word finding difficulties, and difficulty retrieving factual information.  The patient describes her current sleep pattern is getting 8 hours of sleep during the week and 12 hours on the weekend.  She reports that she naps for 2 to 3 hours several times a week during the day.  The patient reports that there has been some increase stress response due to fatigue.  She reports that she can become verbally outspoken and yelling when she gets agitated and that now if she is overly worried or faces major stressors that they can create conflicts and difficulties between the patient and her husband.  The patient describes developing COVID while on a cruise ship in [place].  She reports that about a week after symptoms develop she returned home and went to the emergency department.  Patient reports that her COVID symptoms lasted for several weeks and afterwards she never regained her previous energy levels.  The patient reports that she always had some degree of fatigue and tiredness but post COVID she has been much more fatigued.  The patient has more recently been diagnosed with mild obstructive sleep apnea.  Tests Administered: Controlled Oral Word Association Test (COWAT; FAS & Animals)  Wechsler Adult Intelligence Scale, 4th Edition (WAIS-IV) Wechsler Memory Scale, 4th Edition (WMS-IV); Adult Battery   Participation Level:   Active  Participation Quality:  Appropriate      Behavioral Observation:  The patient appeared well-groomed and appropriately dressed. Her manners were polite and appropriate to the situation. The patient demonstrated a positive attitude toward testing and showed good effort. Some word finding difficulty was observed. The patient became frustrated by the Verbal Paired Associates subtest but continued to comply with instructions and show good effort.  While specifically designed validity testing was not done as the patient was motivated to get valid information for herself  and her own personal needs, she remained motivated and clearly appeared to provide full effort throughout the testing procedures.  This does appear to be a far and valid assessment of the patient current cognitive functioning.    Well Groomed, Alert, and Appropriate.   Test Results:   Initially, an estimation was made as to the patient's premorbid intellectual and cognitive functioning.  The patient graduated from college as well as law school always maintaining a very good GPA.  The patient graduated summa cum laude from college with a full academic scholarship and graduated with very good grades from law school.  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  With this academic and occupational history it is estimated that the patient likely has performed historically in the superior range of intellectual and cognitive functioning and I will estimate her premorbid global intellectual and cognitive functioning to be somewhere roughly 2 standard deviations above normative population groups with some typical are likely variation within individual composite scores showing likely strengths in many areas relative to normative population.  I also reviewed the results of the neuropsychological evaluation conducted on 11/15/2021.  Well overall scores were not provided percentile classifications were provided for measures conducted.  When possible comparisons to similar measures administered will be referenced in this report for comparison purposes.  COWAT FAS Total = 54 Z = 0.83  Animals Total = 33 Z = 2.05     The patient is described difficulties with word finding and verbal fluency that developed shortly after a significant illness with COVID-19 in September 2022.  To objectively assess both semantic fluency and lexical fluency the patient was administered the control oral Word Association test.  She was compared  against normative groups match for her education and gender comparison group.  The patient did very well on the animal naming (semantic fluency) task where she performed roughly 2 standard deviations above normative comparison groups.  The patient showed efficient but less efficient capacity for semantic fluency although she did perform nearly 1 standard deviation above normative comparisons on this measure.  These are very structured challenges and the patient appeared to do well.  Behavioral observations by the psychometrician during the testing procedures did suggest times of word finding difficulties in the psychometrician is specifically trained on how to identify and see these processes happen.  There were also likely word finding difficulties noted during the clinical interview visit with myself.  The patient does display some indications of verbal fluency and word finding difficulties in conversational speech but appears to have good capacity and more structured word finding challenges.   Relative to previous neuropsychological assessment the patient showed improved scores.  On the FAS test administered on 11/15/2021 the patient performed at the 24th percentile and the current assessment she did much better with a percentile rank on the current testing for the FAS test at roughly the 80th percentile.  On the animal naming measure the patient performed at the 58th percentile in March and she performed at roughly the [place] percentile on the current assessment.    WAIS-IV             Composite Score Summary         Scale Sum of Scaled Scores Composite Score Percentile Rank 95% Conf. Interval Qualitative Description  Verbal Comprehension 33 VCI 105 63 99-110 Average  Perceptual Reasoning 39 PRI 117 87 110-122 High Average  Working Memory 18 WMI 95 37 89-102 Average  Processing Speed 22 PSI 105 63 96-113 Average  Full Scale 112 FSIQ 108 70 104-112 Average  General Ability 72 GAI 112 79 107-117  High Average    The patient was administered the Wechsler Adult Intelligence Scale-IV to provide a well normed highly standardized measure of a broad range of intellectual and cognitive functioning.  Given the fact that the patient is reporting changes in her cognitive functioning the resulting score should not be used as a description of her lifelong cognitive capacity but more of a description of her current functioning.  2 global/composite scores were calculated in this assessment.  On the full-scale IQ score the patient produced a full-scale IQ score of 108 which fell at the [facility] and in the upper end of the average range.  This is significantly below predicted levels which would have been roughly a composite score of 130.  This suggest 1 or more area of cognitive change for the patient.  We also calculated the patient's general abilities index score which places less weight/emphasis on cognitive elements of working memory/encoding capacity and information processing speed variables.  The patient's auditory encoding was her lowest level of performance relatively speaking and as expected the patient's general abilities index score was somewhat higher than her full-scale IQ score.  The patient produced a general abilities index score of 112 which falls at the 79th percentile and is in the  high average range.  This is still roughly 1 standard deviation below predicted levels of premorbid functioning.             Verbal Comprehension Subtests Summary     Subtest Raw Score Scaled Score Percentile Rank Reference Group Scaled Score SEM  Similarities 28 11 63 11 1.08  Vocabulary 43 11 63 12 0.73  Information 16 11 63 12 0.67  (Comprehension) 35 18 99.6 18 1.08      The patient produced a verbal comprehension index score of 105 which falls at the 63rd percentile and is in the average range.  Given the patient's educational and occupational history this is significantly below predicted levels.   The patient showed excellent social judgment comprehension skills performing at the 99th percentile.  Other measures of verbal comprehension were in the upper end of the average range related to verbal reasoning ability, vocabulary knowledge and her general fund of information.             Perceptual Reasoning Subtests Summary    Subtest Raw Score Scaled Score Percentile Rank Reference Group Scaled Score SEM  Block Design 45 12 75 10 1.04  Matrix Reasoning 22 15 95 13 0.95  Visual Puzzles 15 12 75 10 0.99  (Figure Weights) 14 11 63 9 0.99  (Picture Completion) 15 12 75 11 1.12    The patient produced a perceptual reasoning index score of 117 which falls in the 87 percentile and is in the high average range.  This is her highest level of functioning on this battery of cognitive measures.  The patient did show some variability within subtest but they all were in the upper end of the average to superior range relative to normative population.  The patient did very well on one specific measure of visual reasoning and problem-solving.  The patient's visual analysis and organization and her visual prediction and problem-solving are also quite efficient as well as her ability to identify visual anomalies within a visual gestalt.  There were no indications of visual spatial, visual reasoning or visual constructional deficits.              Working Hydrographic surveyor Raw Score Scaled Score Percentile Rank Reference Group Scaled Score SEM  Digit Span 25 9 37 8 0.85  Arithmetic 13 9 37 9 1.04  (Letter-Number Seq.) 19 9 37 9 1.08    The patient produced a working memory index score of 95 which fell up to 37 percentile and is in the average range relative to a normative population.  This performance is better than similar measures conducted in March 2023 which fell in the below average to mild/moderately impaired range on various measures.  However, the patient's performance fell at the 37th  percentile and each of the 3 elements assessed related to auditory encoding/working memory.  While this is in the average range it is somewhat below predicted levels of premorbid functioning and is in fact her lowest area of functioning within this particular battery of assessment tools.  The patient did appear to adequately encode information and be able to actively process and manipulate that information in her active register.  It is below the levels of premorbid functioning but are not significantly impaired relative to a normative population.               Processing Speed Subtests Summary     Subtest Raw Score Scaled Score Percentile Rank Reference Group Scaled Score  SEM  Symbol Search 34 12 75 10 1.31  Coding 58 10 50 7 0.99  (Cancellation) 36 10 50 8 1.34      The patient produced a processing speed index score of 105 which falls at the 63rd percentile and is in the average range relative to a normative population.  This performances better than similar performances on her assessment in March 2023 as assessed by the [employer] in March.  The patient performed in the high average to high average range on measures assessing visual scanning, visual searching and overall speed of mental operations (focus execute abilities.  On the previous neuropsychological testing on measures with similar cognitive demands she performed in the moderately impaired range relative to a normative population.     WMS-IV           Index Score Summary       Index Sum of Scaled Scores Index Score Percentile Rank 95% Confidence Interval Qualitative Descriptor  Auditory Memory (AMI) 34 91 27 85-98 Average  Visual Memory (VMI) 44 105 63 99-110 Average  Visual Working Memory (VWMI) 24 112 79 104-118 High Average  Immediate Memory (IMI) 38 96 39 90-102 Average  Delayed Memory (DMI) 40 100 50 93-107 Average      The patient was then administered the Wechsler Memory Scale-IV to provide a thorough and broad  assessment of a wide range of various memory and learning elements.  On the Wechsler Adult Intelligence Scale the patient performed slightly below predicted levels of premorbid functioning but in the average range relative to a normative population on measures of auditory encoding components.  The patient did much better on measures of visual working memory where she produced a visual working memory index score of 112 which fell at the [employer] and is in the high average range and likely consistent with premorbid functioning levels.  There was a significant difference between auditory encoding versus visual encoding with visual encoding being significantly better.  Breaking memory functions down between auditory versus visual memory the patient produced an auditory memory index score of 91 which falls at the 27th percentile and is in the average range.  This is roughly 30 points below predicted levels of premorbid functioning and represents some significant difference and loss from premorbid functioning levels.  The patient did better on visual memory performing nearly 1 standard deviation better.  She produced a visual index score of 105 which falls at the 63rd percentile, although this is below the levels of estimations related to premorbid functioning.  In any event, the patient's visual memory appears to be doing better than her auditory memory.  Breaking down memory functions between immediate versus delayed memory the patient produced an immediate memory index score of 96 which falls in the 39th percentile and is in the average range relative to a normative population.  The patient produced a delayed memory index score of 100 which falls at the 50th percentile and is in the average range.  It does appear that while reduced learning and memory capacity is noted relative to premorbid functioning levels the information that she is able to effectively encode, store and organize initially is retained  over a period of delay.  The patient showed consistent patterns with cued/recognition recalls to her delayed memory score.  This pattern would suggest that the primary issue impacting her memory and learning components have to do with changes in auditory and visual encoding and the greater relative weakness for auditory encoding versus  visual encoding is also reflected in her immediate and delayed memory scores.          Primary Subtest Scaled Score Summary     Subtest Domain Raw Score Scaled Score Percentile Rank  Logical Memory I AM 23 9 37  Logical Memory II AM 21 10 50  Verbal Paired Associates I AM 20 7 16   Verbal Paired Associates II AM 7 8 25   Designs I VM 73 12 75  Designs II VM 71 14 91  Visual Reproduction I VM 34 10 50  Visual Reproduction II VM 15 8 25   Spatial Addition VWM 16 13 84  Symbol Span VWM 24 11 63                Auditory Memory Process Score Summary      Process Score Raw Score Scaled Score Percentile Rank Cumulative Percentage (Base Rate)  LM II Recognition 23 - - 26-50%  VPA II Recognition 37 - - 26-50%                   Visual Memory Process Score Summary      Process Score Raw Score Scaled Score Percentile Rank Cumulative Percentage (Base Rate)  DE I Content 39 13 84 -  DE I Spatial 16 10 50 -  DE II Content 42 16 98 -  DE II Spatial 15 13 84 -  DE II Recognition 16 - - 51-75%  VR II Recognition 4 - - 17-25%            ABILITY-MEMORY ANALYSIS   Ability Score:    GAI: 112 Date of Testing:           WAIS-IV; WMS-IV 2022/03/21             Predicted Difference Method    Index Predicted WMS-IV Index Score Actual WMS-IV Index Score Difference Critical Value   Significant Difference Y/N Base Rate  Auditory Memory 106 91 15 8.95 Y 10-15%  Visual Memory 107 105 2 8.82 N    Visual Working Memory 108 112 -4 11.24 N    Immediate Memory 108 96 12 10.35 Y 15%  Delayed Memory 107 100 7 10.08 N    Statistical significance (critical value) at  the .01 level.      We also calculated the patient's ability-memory analysis utilizing the patient's general abilities index score of 112 to produce a predicted level of performance on various memory and learning indices.  This predicted score is then compared against her actual achieved score.  As would be predicted the patient only showed differences between predicted levels of performance based on her current general abilities index score (which is roughly 1 standard deviation below predictions of premorbid functioning) and her actual achieved scores in the areas of auditory memory and immediate memory indices which is almost completely accounted for by her specific difficulties with auditory memory.  The scores are very consistent with the relative weakness for auditory encoding.   Summary of Results:   Overall, the results of the current neuropsychological evaluation do appear to be valid overall and the patient appeared to put forth good effort throughout the testing procedure and analysis/review of individual test performances would also suggest that the patient put forth good effort.  While global cognitive functioning appears to be roughly 1 standard deviation below estimations and predictions of premorbid functioning a pattern does begin to present itself.  The patient's weakest area relative to both the normative population as  well as significantly below premorbid estimations have to do with auditory encoding capacity and to a lesser extent elements related to information processing speed/focus execute abilities.  The patient's visual encoding appears to be much better and only roughly 1 standard deviation below predicted levels of premorbid functioning.  The resulting weaknesses that are likely changes from premorbid levels related to auditory encoding appears to be the most salient feature related to her difficulties with memory and learning with auditory memory significantly below visual memory  and learning.  In-depth analysis of memory testing do suggest that the encoding element is the primary culprit for her subjective memory difficulties.  The information that is actually encoded, stored and organized initially is retained over a period of delay.  The patient's visual spatial and visual reasoning appear to be quite good and only slightly below predicted levels of premorbid functioning currently in the patient's visual encoding also appears to be only slightly below predicted levels of premorbid functioning.  The patient appears to have well maintained her visual reasoning and problem-solving capacity as well and only slight reductions in her verbal reasoning and problem-solving capacity.  Impression/Diagnosis:   Overall, the results of the current neuropsychological evaluation are quite encouraging.  The patient's performance on the current evaluation are better than those that she achieved in March and the patient does acknowledge that she feels like she is doing better now than she was in March both from a psychological as well as a cognitive standpoint.  She does continue to struggle and has not been able to return to a full schedule at work.  The patient's verbal fluency and word finding abilities appears to be improving but relatively speaking she appears to be weaker with regard to her lexical fluency versus targeted naming fluency.  Given the fact that this was likely quite a strength for the patient relative to both the normative population as well as to her own range of cognitive functioning she is well aware of these changes.  Increased vigilance and concern around verbal fluency can often have a negative impact on verbal fluency itself magnifying its difficulties.  The primary area of difficulty appears to be auditory encoding which can have an impact on a wide range of day-to-day cognitive tasks consistent with the patient's subjective reports.  As far as diagnostic considerations, the  patient clearly does not show any pattern consistent with any progressive neurodegenerative disorder.  Her timeline of difficulties match very closely to her onset and persistent extended illness with COVID.  The patient is continued to be quite fatigued and she has now been diagnosed with a mild obstructive sleep apnea.  Significant anxiety and increasing frustrations and hypervigilance over her difficulties are likely playing a negative role producing a negative feedback loop on her day-to-day functioning.  Given the pattern of cognitive strengths and weaknesses it is most likely that functional issues such as anxiety, depression, fatigue are playing a primary factor.  There is no indications of specific lateralization findings suggestive of any specific or localized cerebrovascular events secondary to COVID.  However, we are quite early in understanding long COVID and the myriad of issues that can develop secondary to COVID based illness and the various regions of the body that this particular coronavirus family can replicate in.  Even mild obstructive sleep apnea can have a specific impact on auditory encoding in particular and combine that with fatigue, anxiety and depression the level of weakness and deficits noted could easily explain the specific deficits primarily  related to auditory encoding and the impact on overall information processing speed and efficiency.  The patient does appear to be improving as far as her anxiety and depressive symptomatology with current psychotropic regimens (Lamictal) and she reports that overall she feels like she is improving cognitively.  I suspect that her COVID illness exacerbated and worsened any underlying obstructive sleep apnea and pulmonary impacts of COVID combined with mild obstructive sleep apnea likely triggered a worsening in this component as well.  The patient has verbalized to her neurologist that she would be interested in trying a positive pressure  therapy/CPAP device and I suspect that this intervention could potentially have a very positive impact on her as far as her psychological and cognitive changes post-COVID.  Diagnosis:    Memory changes  Anxiety  History of depression   _____________________ Ilean Skill, Psy.D. Clinical Neuropsychologist

## 2022-04-27 ENCOUNTER — Other Ambulatory Visit: Payer: Self-pay | Admitting: Behavioral Health

## 2022-04-27 DIAGNOSIS — F411 Generalized anxiety disorder: Secondary | ICD-10-CM

## 2022-04-27 DIAGNOSIS — R454 Irritability and anger: Secondary | ICD-10-CM

## 2022-04-27 DIAGNOSIS — F331 Major depressive disorder, recurrent, moderate: Secondary | ICD-10-CM

## 2022-04-30 NOTE — Telephone Encounter (Signed)
Continue Pristiq to 150 mg daily - from last note  We received RF request for Pristiq 50 mg, but your note says 150. She has a historical entry for 100 mg Pristiq, but we haven't sent an Rx for 100 mg. Should Rx be for 1.5 tabs of 100 mg?

## 2022-05-01 ENCOUNTER — Other Ambulatory Visit: Payer: Self-pay | Admitting: Behavioral Health

## 2022-05-01 NOTE — Telephone Encounter (Signed)
So it is confusing but it is ER version and you are not supposed to break. She already had a large quantity of 100 mg tablets, so this is correct. We increased to 150 mg daily. She just needs this script of 50 mg filled right now. Thank you.

## 2022-05-14 ENCOUNTER — Encounter: Payer: BC Managed Care – PPO | Attending: Psychology | Admitting: Psychology

## 2022-05-14 DIAGNOSIS — R413 Other amnesia: Secondary | ICD-10-CM | POA: Insufficient documentation

## 2022-05-14 DIAGNOSIS — Z8659 Personal history of other mental and behavioral disorders: Secondary | ICD-10-CM | POA: Diagnosis present

## 2022-05-14 DIAGNOSIS — F419 Anxiety disorder, unspecified: Secondary | ICD-10-CM | POA: Insufficient documentation

## 2022-05-15 NOTE — Progress Notes (Signed)
05/14/2022 11 AM-12 PM:  Today's visit was an in person visit conducted in my outpatient clinic office with the patient and myself present.  We spent 1 hour of time going over the results of the recent neuropsychological evaluation with particular emphasis placed on how the opinion was derived and going over treatment recommendations in greater depth.  The patient reports that she has continued to show some improvement but continues to have some lexical fluency issues and slowed information processing speed but overall she is improving.  I recommended that she absolutely follow-up on assessment for obstructive sleep apnea and potential benefit of a CPAP type device if warranted.  I have included a copy of the impression/diagnosis and recommendations below for convenience and her entire neuropsychological evaluation can be found in her EMR dated 04/23/2022.   Reason For Service:                         Monica Perez is a 56 year old female referred for neuropsychological evaluation due to ongoing changes in memory, expressive fluency and word finding and persistent fatigue following/developing post COVID-19 infection in September 2022.  The patient has been followed by Jeannie Fend, MD with [facility] neurology and has been diagnosed with mild sleep apnea and has been presenting with complaints of "brain fog" and other cognitive difficulties post-COVID.  The patient did have a previous neuropsychological evaluation conducted on 11/15/2020 that can be found in her EMR.  However, the patient had concerns about how this neuropsychological assessment was interpreted and she did not get adequate answers to her questions regarding her cognitive functioning.  Questions about the validity of her effort etc. were made and the patient pointed out that this evaluation was for her needs and that there are no clear secondary gains or justification for attempts to exaggerate or simulate any type of illness or difficulty.  This  assessment is to follow-up and provide a reassessment to address some of the specific questions that the patient has.   Impression/Diagnosis:                     Overall, the results of the current neuropsychological evaluation are quite encouraging.  The patient's performance on the current evaluation are better than those that she achieved in March and the patient does acknowledge that she feels like she is doing better now than she was in March both from a psychological as well as a cognitive standpoint.  She does continue to struggle and has not been able to return to a full schedule at work.  The patient's verbal fluency and word finding abilities appears to be improving but relatively speaking she appears to be weaker with regard to her lexical fluency versus targeted naming fluency.  Given the fact that this was likely quite a strength for the patient relative to both the normative population as well as to her own range of cognitive functioning she is well aware of these changes.  Increased vigilance and concern around verbal fluency can often have a negative impact on verbal fluency itself magnifying its difficulties.  The primary area of difficulty appears to be auditory encoding which can have an impact on a wide range of day-to-day cognitive tasks consistent with the patient's subjective reports.   As far as diagnostic considerations, the patient clearly does not show any pattern consistent with any progressive neurodegenerative disorder.  Her timeline of difficulties match very closely to her onset and persistent extended  illness with COVID.  The patient is continued to be quite fatigued and she has now been diagnosed with a mild obstructive sleep apnea.  Significant anxiety and increasing frustrations and hypervigilance over her difficulties are likely playing a negative role producing a negative feedback loop on her day-to-day functioning.  Given the pattern of cognitive strengths and weaknesses  it is most likely that functional issues such as anxiety, depression, fatigue are playing a primary factor.  There is no indications of specific lateralization findings suggestive of any specific or localized cerebrovascular events secondary to COVID.  However, we are quite early in understanding long COVID and the myriad of issues that can develop secondary to COVID based illness and the various regions of the body that this particular coronavirus family can replicate in.  Even mild obstructive sleep apnea can have a specific impact on auditory encoding in particular and combine that with fatigue, anxiety and depression the level of weakness and deficits noted could easily explain the specific deficits primarily related to auditory encoding and the impact on overall information processing speed and efficiency.   The patient does appear to be improving as far as her anxiety and depressive symptomatology with current psychotropic regimens (Lamictal) and she reports that overall she feels like she is improving cognitively.  I suspect that her COVID illness exacerbated and worsened any underlying obstructive sleep apnea and pulmonary impacts of COVID combined with mild obstructive sleep apnea likely triggered a worsening in this component as well.  The patient has verbalized to her neurologist that she would be interested in trying a positive pressure therapy/CPAP device and I suspect that this intervention could potentially have a very positive impact on her as far as her psychological and cognitive changes post-COVID.   Diagnosis:                                Memory changes   Anxiety   History of depression     _____________________ Ilean Skill, Psy.D. Clinical Neuropsychologist

## 2022-05-17 ENCOUNTER — Other Ambulatory Visit: Payer: Self-pay | Admitting: Vascular Surgery

## 2022-05-24 ENCOUNTER — Encounter: Payer: Self-pay | Admitting: Behavioral Health

## 2022-05-24 ENCOUNTER — Ambulatory Visit (INDEPENDENT_AMBULATORY_CARE_PROVIDER_SITE_OTHER): Payer: BC Managed Care – PPO | Admitting: Behavioral Health

## 2022-05-24 DIAGNOSIS — F411 Generalized anxiety disorder: Secondary | ICD-10-CM

## 2022-05-24 DIAGNOSIS — F331 Major depressive disorder, recurrent, moderate: Secondary | ICD-10-CM

## 2022-05-24 DIAGNOSIS — R454 Irritability and anger: Secondary | ICD-10-CM | POA: Diagnosis not present

## 2022-05-24 MED ORDER — LAMOTRIGINE 25 MG PO TABS: 50.0000 mg | ORAL_TABLET | Freq: Every day | ORAL | 2 refills | Status: DC

## 2022-05-24 MED ORDER — DESVENLAFAXINE SUCCINATE ER 100 MG PO TB24: 100.0000 mg | ORAL_TABLET | Freq: Every day | ORAL | 3 refills | Status: DC

## 2022-05-24 MED ORDER — DESVENLAFAXINE SUCCINATE ER 50 MG PO TB24: ORAL_TABLET | ORAL | 3 refills | Status: DC

## 2022-05-24 NOTE — Progress Notes (Signed)
[facility]  Patient ID: Monica Perez,  MRN: [id number]  PCP: Verl Bangs, MD  Date of Evaluation: 05/24/2022 Time spent:30 minutes  Chief Complaint:  Chief Complaint   Depression; Anxiety; Follow-up; Medication Refill; Medication Problem; Patient Education     HISTORY/CURRENT STATUS: HPI  56 year old female presents to this office for follow up and medication management. Says the Lamicatal is helping with moods but it is causing her to feel blunted. She says "I do not  feel like Im my same fun self". She either wants to stop or reduce the medicaiton.   She feels like she could benefit from possibly decreasing the dosage with Lamictal.  Says her anxiety today is 4/10 and depression is 3/10.  She denies mania, psychosis, auditory or visual hallucinations.  No SI or HI.   Past psychotropic medications: Prozac Wellbutrin    Individual Medical History/ Review of Systems: Changes? :No   Allergies: Erythromycin, Lactose, Aspirin, Atovaquone-proguanil hcl, Other, Penicillins, Erythromycin base, Morphine and related, Sulfa antibiotics, and Sulfamethoxazole-trimethoprim  Current Medications:  Current Outpatient Medications:    lamoTRIgine (LAMICTAL) 25 MG tablet, Take 2 tablets (50 mg total) by mouth daily., Disp: 60 tablet, Rfl: 2   atorvastatin (LIPITOR) 40 MG tablet, Take 40 mg by mouth daily., Disp: , Rfl:    Cholecalciferol (VITAMIN D3 SUPER STRENGTH) 50 MCG (2000 UT) TABS, Take 2,000 Units by mouth daily., Disp: , Rfl:    clopidogrel (PLAVIX) 75 MG tablet, TAKE 1 TABLET DAILY, Disp: 90 tablet, Rfl: 3   clotrimazole (MYCELEX) 10 MG troche, Take by mouth., Disp: , Rfl:    desvenlafaxine (PRISTIQ) 100 MG 24 hr tablet, Take 1 tablet (100 mg total) by mouth at bedtime., Disp: 30 tablet, Rfl: 3   desvenlafaxine (PRISTIQ) 50 MG 24 hr tablet, TAKE 1 TABLET(50 MG) BY MOUTH DAILY, Disp: 30 tablet, Rfl: 3   lamoTRIgine (LAMICTAL) 100 MG tablet, Take 1 tablet (100  mg total) by mouth daily., Disp: 30 tablet, Rfl: 1   lamoTRIgine (LAMICTAL) 25 MG tablet, Take one tablet daily for 14 days, then take two tablets 50 mg total daily., Disp: 60 tablet, Rfl: 1   LORazepam (ATIVAN) 0.5 MG tablet, , Disp: , Rfl:    Multiple Vitamin (MULTI-VITAMIN DAILY PO), Take 1 tablet by mouth daily., Disp: , Rfl:   Current Facility-Administered Medications:    0.9 %  sodium chloride infusion, 250 mL, Intravenous, PRN, Nada Libman, MD   sodium chloride flush (NS) 0.9 % injection 3 mL, 3 mL, Intravenous, Q12H, Brabham, Fran Lowes, MD Medication Side Effects: none  Family Medical/ Social History: Changes? No  MENTAL HEALTH EXAM:  Last menstrual period 09/03/2011.There is no height or weight on file to calculate BMI.  General Appearance: Casual  Eye Contact:  Good  Speech:  Clear and Coherent  Volume:  Normal  Mood:  Anxious and Depressed  Affect:  Appropriate  Thought Process:  Coherent  Orientation:  Full (Time, Place, and Person)  Thought Content: Logical   Suicidal Thoughts:  No  Homicidal Thoughts:  No  Memory:  WNL  Judgement:  Good  Insight:  Good  Psychomotor Activity:  Normal  Concentration:  Concentration: Good  Recall:  Good  Fund of Knowledge: Good  Language: Good  Assets:  Desire for Improvement  ADL's:  Intact  Cognition: WNL  Prognosis:  Good    DIAGNOSES:    ICD-10-CM   1. Major depressive disorder, recurrent episode, moderate (HCC)  F33.1 lamoTRIgine (LAMICTAL) 25 MG tablet  desvenlafaxine (PRISTIQ) 100 MG 24 hr tablet    desvenlafaxine (PRISTIQ) 50 MG 24 hr tablet    2. Generalized anxiety disorder  F41.1 lamoTRIgine (LAMICTAL) 25 MG tablet    desvenlafaxine (PRISTIQ) 100 MG 24 hr tablet    desvenlafaxine (PRISTIQ) 50 MG 24 hr tablet    3. Irritability  R45.4 lamoTRIgine (LAMICTAL) 25 MG tablet    desvenlafaxine (PRISTIQ) 100 MG 24 hr tablet    desvenlafaxine (PRISTIQ) 50 MG 24 hr tablet      Receiving Psychotherapy: No     RECOMMENDATIONS:   Greater than 50% of  30  min face to face time with patient was spent on counseling and coordination of care. Discussed her moderate improvement with mood, irritability and short fuse with husband. Feels like she has improved but doe not like the way the medication makes her feel blunted. She either wants to stop or decrease the medication.   She is requesting increase of Lamictal this visit.  We agreed to: Continue Pristiq to 150 mg daily Decrease Lamictal to 50 mg daily.  Will report worsening symptoms promptly To follow up in 2  months to reassess Provided emergency contact information Monitor for any sign of rash. Please taking Lamictal and contact office immediately rash develops. Recommend seeking urgent medical attention if rash is severe and/or spreading quickly.  Reviewed PDMP    Elwanda Brooklyn, NP

## 2022-06-11 ENCOUNTER — Other Ambulatory Visit: Payer: Self-pay | Admitting: *Deleted

## 2022-06-11 DIAGNOSIS — I871 Compression of vein: Secondary | ICD-10-CM

## 2022-06-11 NOTE — Progress Notes (Unsigned)
Office Note   History of Present Illness   Monica Perez is a 56 y.o. 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  She is a history of left lower extremity swelling and pain, with remote history of DVT.  She underwent left common and external iliac vein stenting on 08/28/2020 by Dr. Donzetta Matters.  At her last visit with [place] she was transition from Xarelto to Plavix indefinitely. She has an allergy to aspirin.  She was not experiencing any leg swelling.  She was occasionally wearing her compression stockings when needed.  At today's visit, she feels great. She has not experienced any more leg swelling or DVTs. She only has to wear her compression stockings on a plane. She is still taking her plavix and notes bruising more easily. She denies any issues with bleeding.  She is able to stay very active and denies any pain in her legs when she walks. She does have plantar fasciitis in her R foot that she intends getting checked out.  Current Outpatient Medications  Medication Sig Dispense Refill   atorvastatin (LIPITOR) 40 MG tablet Take 40 mg by mouth daily.     Cholecalciferol (VITAMIN D3 SUPER STRENGTH) 50 MCG (2000 UT) TABS Take 2,000 Units by mouth daily.     clopidogrel (PLAVIX) 75 MG tablet TAKE 1 TABLET DAILY 90 tablet 3   clotrimazole (MYCELEX) 10 MG troche Take by mouth.     desvenlafaxine (PRISTIQ) 100 MG 24 hr tablet Take 1 tablet (100 mg total) by mouth at bedtime. 30 tablet 3   desvenlafaxine (PRISTIQ) 50 MG 24 hr tablet TAKE 1 TABLET(50 MG) BY MOUTH DAILY 30 tablet 3   lamoTRIgine (LAMICTAL) 100 MG tablet Take 1 tablet (100 mg total) by mouth daily. 30 tablet 1   lamoTRIgine (LAMICTAL) 25 MG tablet Take one tablet daily for 14 days, then take two tablets 50 mg total daily. 60 tablet 1   lamoTRIgine (LAMICTAL) 25 MG tablet Take 2 tablets (50 mg total) by mouth daily. 60 tablet 2   LORazepam (ATIVAN) 0.5 MG tablet      Multiple Vitamin (MULTI-VITAMIN  DAILY PO) Take 1 tablet by mouth daily.     Current Facility-Administered Medications  Medication Dose Route Frequency Provider Last Rate Last Admin   0.9 %  sodium chloride infusion  250 mL Intravenous PRN Serafina Mitchell, MD       sodium chloride flush (NS) 0.9 % injection 3 mL  3 mL Intravenous Q12H Serafina Mitchell, MD        REVIEW OF SYSTEMS (negative unless checked):   Cardiac:  []  Chest pain or chest pressure? []  Shortness of breath upon activity? []  Shortness of breath when lying flat? []  Irregular heart rhythm?  Vascular:  []  Pain in calf, thigh, or hip brought on by walking? []  Pain in feet at night that wakes you up from your sleep? []  Blood clot in your veins? []  Leg swelling?  Pulmonary:  []  Oxygen at home? []  Productive cough? []  Wheezing?  Neurologic:  []  Sudden weakness in arms or legs? []  Sudden numbness in arms or legs? []  Sudden onset of difficult speaking or slurred speech? []  Temporary loss of vision in one eye? []  Problems with dizziness?  Gastrointestinal:  []  Blood in stool? []  Vomited blood?  Genitourinary:  []  Burning when urinating? []  Blood in urine?  Psychiatric:  []  Major depression  Hematologic:  []  Bleeding problems? []  Problems with blood clotting?  Dermatologic:  []   Rashes or ulcers?  Constitutional:  []  Fever or chills?  Ear/Nose/Throat:  []  Change in hearing? []  Nose bleeds? []  Sore throat?  Musculoskeletal:  []  Back pain? []  Joint pain? []  Muscle pain?   Physical Examination   Vitals:   06/12/22 0850  BP: 111/81  Pulse: 93  Resp: 18  Temp: (!) 97.2 F (36.2 C)  SpO2: 97%  Weight: 223 lb 8 oz (101.4 kg)  Height: 5\' 6"  (1.676 m)   Body mass index is 36.07 kg/m.  General:  WDWN in NAD; vital signs documented above Gait: Not observed HENT: WNL, normocephalic Pulmonary: normal non-labored breathing , without rales, rhonchi,  wheezing Cardiac: regular HR, without murmurs without carotid  bruit Abdomen: soft, NT, no masses Skin: without rashes Vascular Exam/Pulses: Palpable pedal pulses Extremities: without ischemic changes, without Gangrene , without cellulitis; without open wounds;  Musculoskeletal: no muscle wasting or atrophy  Neurologic: A&O X 3;  No focal weakness or paresthesias are detected Psychiatric:  The pt has Normal affect.   Non-Invasive Vascular Imaging   IVC/Iliac Duplex (06/12/2022)  IVC/Iliac Findings:  +----------+------+--------+--------+     IVC    PatentThrombusComments  +----------+------+--------+--------+  IVC Prox  patent                  +----------+------+--------+--------+  IVC Mid   patent                  +----------+------+--------+--------+  IVC Distalpatent                  +----------+------+--------+--------+      +-------------------+---------+-----------+---------+-----------+--------+          CIV        RT-PatentRT-ThrombusLT-PatentLT-ThrombusComments  +-------------------+---------+-----------+---------+-----------+--------+  Common Iliac Prox                       patent                       +-------------------+---------+-----------+---------+-----------+--------+  Common Iliac Mid                        patent                       +-------------------+---------+-----------+---------+-----------+--------+  Common Iliac Distal                     patent                       +-------------------+---------+-----------+---------+-----------+--------+       +-------------------------+---------+-----------+---------+-----------+----  ----+             EIV            RT-PatentRT-ThrombusLT-PatentLT-ThrombusComments  +-------------------------+---------+-----------+---------+-----------+----  ----+  External Iliac Vein Prox                      patent                         +-------------------------+---------+-----------+---------+-----------+----   ----+  External Iliac Vein Mid                       patent                         +-------------------------+---------+-----------+---------+-----------+----  ----+  External Iliac Vein  patent                         Distal                                                                       +-------------------------+---------+-----------+---------+-----------+----  ----+       Medical Decision Making   LILLIAM CHAMBLEE is a 56 y.o. 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 There is no sign of stent stenosis. She has not been experiencing any lower extremity swelling. She has not had another DVT. She can continue to wear her compression stockings as needed I have renewed her plavix prescription for another year. She can follow up with [place] in 1 year with repeat left IVC/iliac venous duplex study She knows to follow up with [place] sooner if she has any issues   Vicente Serene PA-C [facility] Office: [facility] MD: Donzetta Matters

## 2022-06-12 ENCOUNTER — Ambulatory Visit (HOSPITAL_COMMUNITY)
Admission: RE | Admit: 2022-06-12 | Discharge: 2022-06-12 | Disposition: A | Discharge status: Home / Self Care | Payer: BC Managed Care – PPO | Source: Ambulatory Visit | Attending: Vascular Surgery | Admitting: Vascular Surgery

## 2022-06-12 ENCOUNTER — Ambulatory Visit (INDEPENDENT_AMBULATORY_CARE_PROVIDER_SITE_OTHER): Payer: BC Managed Care – PPO | Admitting: Physician Assistant

## 2022-06-12 VITALS — BP 111/81 | HR 93 | Temp 97.2°F | Resp 18 | Ht 66.0 in | Wt 223.5 lb

## 2022-06-12 DIAGNOSIS — I871 Compression of vein: Secondary | ICD-10-CM | POA: Diagnosis not present

## 2022-06-12 MED ORDER — CLOPIDOGREL BISULFATE 75 MG PO TABS: 75.0000 mg | ORAL_TABLET | Freq: Every day | ORAL | 3 refills | Status: DC

## 2022-06-17 ENCOUNTER — Ambulatory Visit: Payer: BC Managed Care – PPO | Admitting: Psychology

## 2022-07-29 ENCOUNTER — Encounter: Payer: Self-pay | Admitting: Behavioral Health

## 2022-07-29 ENCOUNTER — Telehealth (INDEPENDENT_AMBULATORY_CARE_PROVIDER_SITE_OTHER): Payer: BC Managed Care – PPO | Admitting: Behavioral Health

## 2022-07-29 DIAGNOSIS — R454 Irritability and anger: Secondary | ICD-10-CM

## 2022-07-29 DIAGNOSIS — F331 Major depressive disorder, recurrent, moderate: Secondary | ICD-10-CM

## 2022-07-29 DIAGNOSIS — F411 Generalized anxiety disorder: Secondary | ICD-10-CM | POA: Diagnosis not present

## 2022-07-29 MED ORDER — DESVENLAFAXINE SUCCINATE ER 100 MG PO TB24: 100.0000 mg | ORAL_TABLET | Freq: Every day | ORAL | 2 refills | Status: DC

## 2022-07-29 MED ORDER — LAMOTRIGINE 25 MG PO TABS: ORAL_TABLET | ORAL | 2 refills | Status: DC

## 2022-07-29 NOTE — Progress Notes (Signed)
Monica Perez [id number] [birth year] 56 y.o.  Virtual Visit via Video Note  I connected with pt @ on 07/29/22 at 11:00 AM EST by a video enabled telemedicine application and verified that I am speaking with the correct person using two identifiers.   I discussed the limitations of evaluation and management by telemedicine and the availability of in person appointments. The patient expressed understanding and agreed to proceed.  I discussed the assessment and treatment plan with the patient. The patient was provided an opportunity to ask questions and all were answered. The patient agreed with the plan and demonstrated an understanding of the instructions.   The patient was advised to call back or seek an in-person evaluation if the symptoms worsen or if the condition fails to improve as anticipated.  I provided 30 minutes of non-face-to-face time during this encounter.  The patient was located at home.  The provider was located at [facility].   Monica Flores, NP   Subjective:   Patient ID:  Monica Perez is a 56 y.o. (DOB 04-10-1966) female.  Chief Complaint:  Chief Complaint  Patient presents with   Depression   Anxiety   Follow-up   Patient Education   Medication Problem   Medication Refill   Family Problem    HPI  56 year old female presents to this office for follow up and medication management. She reduced her Lamictal because she feels blunted and not like herself. Says that her anxiety and depression have become worse. Says that she lashes out at her husband more. She  is requesting medication adjustment.  Says her anxiety today is 4/10 and depression is 3/10.  She denies mania, psychosis, auditory or visual hallucinations.  No SI or HI.   Past psychotropic medications: Prozac Wellbutrin   Review of Systems:  Review of Systems  Constitutional: Negative.   Allergic/Immunologic: Negative.   Neurological: Negative.   Psychiatric/Behavioral:   Positive for dysphoric mood. The patient is nervous/anxious.     Medications: I have reviewed the patient's current medications.  Current Outpatient Medications  Medication Sig Dispense Refill   desvenlafaxine (PRISTIQ) 100 MG 24 hr tablet Take 1 tablet (100 mg total) by mouth daily. 60 tablet 2   atorvastatin (LIPITOR) 40 MG tablet Take 40 mg by mouth daily.     Cholecalciferol (VITAMIN D3 SUPER STRENGTH) 50 MCG (2000 UT) TABS Take 2,000 Units by mouth daily.     clopidogrel (PLAVIX) 75 MG tablet Take 1 tablet (75 mg total) by mouth daily. 90 tablet 3   clotrimazole (MYCELEX) 10 MG troche Take by mouth.     desvenlafaxine (PRISTIQ) 100 MG 24 hr tablet Take 1 tablet (100 mg total) by mouth at bedtime. 30 tablet 3   desvenlafaxine (PRISTIQ) 50 MG 24 hr tablet TAKE 1 TABLET(50 MG) BY MOUTH DAILY 30 tablet 3   lamoTRIgine (LAMICTAL) 100 MG tablet Take 1 tablet (100 mg total) by mouth daily. 30 tablet 1   lamoTRIgine (LAMICTAL) 25 MG tablet Take 2 tablets (50 mg total) by mouth daily. 60 tablet 2   lamoTRIgine (LAMICTAL) 25 MG tablet Take one tablet daily for 14 days, then take two tablets 50 mg total daily. 60 tablet 2   LORazepam (ATIVAN) 0.5 MG tablet      Multiple Vitamin (MULTI-VITAMIN DAILY PO) Take 1 tablet by mouth daily.     Current Facility-Administered Medications  Medication Dose Route Frequency Provider Last Rate Last Admin   0.9 %  sodium chloride infusion  250  mL Intravenous PRN Serafina Mitchell, MD       sodium chloride flush (NS) 0.9 % injection 3 mL  3 mL Intravenous Q12H Serafina Mitchell, MD        Medication Side Effects: None  Allergies:  Allergies  Allergen Reactions   Erythromycin Hives   Lactose Diarrhea   Aspirin Other (See Comments)    bleeding   Atovaquone-Proguanil Hcl Nausea Only   Other Hives    Surgical glue   Penicillins Hives and Rash    Reaction: Childhood    Erythromycin Base Hives and Rash        Morphine And Related Hives and Rash   Sulfa  Antibiotics Hives and Rash        Sulfamethoxazole-Trimethoprim Hives and Rash         Past Medical History:  Diagnosis Date   Anxiety    DVT of lower extremity (deep venous thrombosis) (HCC)    Hyperlipemia    Peripheral arterial disease (HCC)    PONV (postoperative nausea and vomiting)     Family History  Problem Relation Age of Onset   Anxiety disorder Brother    Depression Brother    Bipolar disorder Brother    Suicidality Brother    Alcohol abuse Maternal Aunt    Alcohol abuse Paternal Aunt    Suicidality Maternal Grandmother     Social History   Socioeconomic History   Marital status: Married    Spouse name: Not on file   Number of children: 0   Years of education: 19   Highest education level: Doctorate  Occupational History   Occupation: Forensic psychologist    Comment: Engineer, structural  Tobacco Use   Smoking status: Never   Smokeless tobacco: Never  Scientific laboratory technician Use: Never used  Substance and Sexual Activity   Alcohol use: Yes    Comment: light social drinker   Drug use: Never   Sexual activity: Yes  Other Topics Concern   Not on file  Social History Narrative   Lives in [place]    Social Determinants of Health   Financial Resource Strain: Not on file  Food Insecurity: Not on file  Transportation Needs: Not on file  Physical Activity: Not on file  Stress: Not on file  Social Connections: Not on file  Intimate Partner Violence: Not on file    Past Medical History, Surgical history, Social history, and Family history were reviewed and updated as appropriate.   Please see review of systems for further details on the patient's review from today.   Objective:   Physical Exam:  LMP 09/03/2011   Physical Exam Neurological:     Mental Status: She is alert and oriented to person, place, and time.  Psychiatric:        Attention and Perception: Attention and perception normal.        Mood and Affect: Mood normal.         Speech: Speech normal.        Behavior: Behavior normal. Behavior is cooperative.        Cognition and Memory: Cognition and memory normal.        Judgment: Judgment normal.     Comments: Insight intact     Lab Review:     Component Value Date/Time   NA 140 03/26/2022 1151   K 4.2 03/26/2022 1151   CL 103 03/26/2022 1151   CO2 29 03/26/2022 1151   GLUCOSE 121 (H)  03/26/2022 1151   BUN 13 03/26/2022 1151   CREATININE 0.92 03/26/2022 1151   CREATININE 0.87 06/16/2020 0919   CALCIUM 10.2 03/26/2022 1151   PROT 6.5 06/16/2020 0919   ALBUMIN 4.6 03/26/2022 1151   AST 32 06/16/2020 0919   ALT 64 (H) 06/16/2020 0919   ALKPHOS 69 06/16/2020 0919   BILITOT 0.6 06/16/2020 0919   GFRNONAA >60 08/29/2020 0137   GFRNONAA >60 06/16/2020 0919   GFRAA >60 08/13/2019 0821       Component Value Date/Time   WBC 12.1 (H) 08/29/2020 0137   RBC 4.09 08/29/2020 0137   HGB 12.5 08/29/2020 0137   HGB 13.1 06/16/2020 0919   HCT 39.3 08/29/2020 0137   PLT 265 08/29/2020 0137   PLT 243 06/16/2020 0919   MCV 96.1 08/29/2020 0137   MCH 30.6 08/29/2020 0137   MCHC 31.8 08/29/2020 0137   RDW 14.1 08/29/2020 0137   LYMPHSABS 2.3 06/16/2020 0919   MONOABS 0.7 06/16/2020 0919   EOSABS 0.3 06/16/2020 0919   BASOSABS 0.1 06/16/2020 0919    No results found for: "POCLITH", "LITHIUM"   No results found for: "PHENYTOIN", "PHENOBARB", "VALPROATE", "CBMZ"   .res Assessment: Plan:    RECOMMENDATIONS:    Greater than 50% of  30  min face to face time with patient was spent on counseling and coordination of care. Discussed her more recently decline with anxiety, depression, and moods swings. She does not like the way Lamictal makes her feel.  She is requesting medication adjustment this visit.   We agreed to: Increase  Pristiq to 200 mg daily Decrease Lamictal to 25 mg mg daily.  Will report worsening symptoms promptly To follow up in 2  months to reassess Provided emergency contact  information Monitor for any sign of rash. Please taking Lamictal and contact office immediately rash develops. Recommend seeking urgent medical attention if rash is severe and/or spreading quickly.  She would like to have [employer]  packet conducted next visit when she gets back from [place], NP           Monica Perez was seen today for depression, anxiety, follow-up, patient education, medication problem, medication refill and family problem.  Diagnoses and all orders for this visit:  Major depressive disorder, recurrent episode, moderate (HCC) -     desvenlafaxine (PRISTIQ) 100 MG 24 hr tablet; Take 1 tablet (100 mg total) by mouth daily. -     lamoTRIgine (LAMICTAL) 25 MG tablet; Take one tablet daily for 14 days, then take two tablets 50 mg total daily.  Generalized anxiety disorder -     desvenlafaxine (PRISTIQ) 100 MG 24 hr tablet; Take 1 tablet (100 mg total) by mouth daily. -     lamoTRIgine (LAMICTAL) 25 MG tablet; Take one tablet daily for 14 days, then take two tablets 50 mg total daily.  Irritability -     desvenlafaxine (PRISTIQ) 100 MG 24 hr tablet; Take 1 tablet (100 mg total) by mouth daily. -     lamoTRIgine (LAMICTAL) 25 MG tablet; Take one tablet daily for 14 days, then take two tablets 50 mg total daily.     Please see After Visit Summary for patient specific instructions.  Future Appointments  Date Time Provider [place]  09/23/2022  1:30 PM Lesle Chris A, NP [facility] None  03/27/2023 11:10 AM Shamleffer, Melanie Crazier, MD [facility] None    No orders of the defined types were placed in this  encounter.     -------------------------------

## 2022-08-01 ENCOUNTER — Other Ambulatory Visit: Payer: Self-pay

## 2022-08-01 DIAGNOSIS — F331 Major depressive disorder, recurrent, moderate: Secondary | ICD-10-CM

## 2022-08-01 DIAGNOSIS — R454 Irritability and anger: Secondary | ICD-10-CM

## 2022-08-01 DIAGNOSIS — F411 Generalized anxiety disorder: Secondary | ICD-10-CM

## 2022-08-01 MED ORDER — DESVENLAFAXINE SUCCINATE ER 100 MG PO TB24: 200.0000 mg | ORAL_TABLET | Freq: Every day | ORAL | 2 refills | Status: DC

## 2022-08-28 ENCOUNTER — Ambulatory Visit: Payer: BC Managed Care – PPO | Admitting: Psychology

## 2022-09-23 ENCOUNTER — Ambulatory Visit: Payer: BC Managed Care – PPO | Admitting: Behavioral Health

## 2022-09-24 ENCOUNTER — Other Ambulatory Visit: Payer: Self-pay | Admitting: Orthopaedic Surgery

## 2022-09-24 DIAGNOSIS — M79671 Pain in right foot: Secondary | ICD-10-CM

## 2022-10-14 ENCOUNTER — Ambulatory Visit
Admission: RE | Admit: 2022-10-14 | Discharge: 2022-10-14 | Disposition: A | Discharge status: Home / Self Care | Payer: BC Managed Care – PPO | Source: Ambulatory Visit | Attending: Orthopaedic Surgery | Admitting: Orthopaedic Surgery

## 2022-10-14 DIAGNOSIS — M79671 Pain in right foot: Secondary | ICD-10-CM

## 2023-03-27 ENCOUNTER — Encounter: Payer: Self-pay | Admitting: Internal Medicine

## 2023-03-27 ENCOUNTER — Ambulatory Visit: Payer: BC Managed Care – PPO | Admitting: Internal Medicine

## 2023-03-27 VITALS — BP 106/70 | HR 74 | Ht 66.0 in | Wt 197.0 lb

## 2023-03-27 DIAGNOSIS — M859 Disorder of bone density and structure, unspecified: Secondary | ICD-10-CM | POA: Diagnosis not present

## 2023-03-27 DIAGNOSIS — E213 Hyperparathyroidism, unspecified: Secondary | ICD-10-CM | POA: Diagnosis not present

## 2023-03-27 LAB — CBC
HCT: 40.3 % (ref 36.0–46.0)
Hemoglobin: 13 g/dL (ref 12.0–15.0)
MCHC: 32.3 g/dL (ref 30.0–36.0)
MCV: 91.3 fl (ref 78.0–100.0)
Platelets: 293 10*3/uL (ref 150.0–400.0)
RBC: 4.42 Mil/uL (ref 3.87–5.11)
RDW: 14.4 % (ref 11.5–15.5)
WBC: 8.3 10*3/uL (ref 4.0–10.5)

## 2023-03-27 LAB — COMPREHENSIVE METABOLIC PANEL
ALT: 21 U/L (ref 0–35)
AST: 19 U/L (ref 0–37)
Albumin: 4.4 g/dL (ref 3.5–5.2)
Alkaline Phosphatase: 55 U/L (ref 39–117)
BUN: 16 mg/dL (ref 6–23)
CO2: 28 mEq/L (ref 19–32)
Calcium: 9.9 mg/dL (ref 8.4–10.5)
Chloride: 105 mEq/L (ref 96–112)
Creatinine, Ser: 0.95 mg/dL (ref 0.40–1.20)
GFR: 66.89 mL/min (ref >60.00)
Glucose, Bld: 99 mg/dL (ref 70–99)
Potassium: 4 mEq/L (ref 3.5–5.1)
Sodium: 142 mEq/L (ref 135–145)
Total Bilirubin: 0.8 mg/dL (ref 0.2–1.2)
Total Protein: 6.7 g/dL (ref 6.0–8.3)

## 2023-03-27 LAB — LIPID PANEL
Cholesterol: 153 mg/dL (ref 0–200)
HDL: 49.6 mg/dL (ref >39.00)
LDL Cholesterol: 75 mg/dL (ref 0–99)
NonHDL: 103.72
Total CHOL/HDL Ratio: 3
Triglycerides: 144 mg/dL (ref 0.0–149.0)
VLDL: 28.8 mg/dL (ref 0.0–40.0)

## 2023-03-27 LAB — VITAMIN D 25 HYDROXY (VIT D DEFICIENCY, FRACTURES): VITD: 48.97 ng/mL (ref 30.00–100.00)

## 2023-03-27 NOTE — Patient Instructions (Signed)
-   Stay Hydrated  - Continue Vitamin D 2000 iu daily  - Maintain 2-3 servings of dietary calcium daily  - Avoid over the counter calcium tablets

## 2023-03-27 NOTE — Progress Notes (Signed)
Name: Monica Perez  MRN/ DOB: [id number], 07/03/1966    Age/ Sex: 57 y.o., female    PCP: Verl Bangs, MD   Reason for Endocrinology Evaluation: Hypercalcemia      Date of Initial Endocrinology Evaluation: 05/29/2021    HPI: Monica Perez is a 57 y.o. female with a past medical history of Dyslipidemia, Depression and anxiety . The patient presented for initial endocrinology clinic visit on 05/29/2021 for consultative assistance with her Hypercalcemia .     Ms. Men indicates that she was diagnosed with hypercalcemia in 03/2021 with a serum calcium of 11.0 mg/dL , albumin 4.8 g/dL. Since that time, she denies  experienced symptoms of constipation, polyuria, polydipsia. She takes  use of over the counter calcium (MVI and every other day calcium ), lithium, or HCTZ.     She denies  history of kidney stones, kidney disease, or granulomatous disease.She has elevated LFT's, was though fatty liver , after losing 15 lbs her lFT's improved .  She has osteoporosis but DXA 2021 showed low bone density she  had a broken arm at age 68 and toes as an adult.   She denies family history of osteoporosis, parathyroid disease.   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    Patient follows with [facility] for weight management, on Mounjaro   She stays hydrated  Denies renal stones  Denies polydipsia  Has constipation due to [facility]  She consumes 2-3  servings of calcium   Vitamin D 2000 iu daily     HISTORY:  Past Medical History:  Past Medical History:  Diagnosis Date   Anxiety    DVT of lower extremity (deep venous thrombosis) (HCC)    Hyperlipemia    Peripheral arterial disease (HCC)    PONV (postoperative nausea and vomiting)    Past Surgical History:  Past Surgical  History:  Procedure Laterality Date   APPENDECTOMY     BUNIONECTOMY Right 07/20/2020   Procedure: Excision right bunionette;  Surgeon: Toni Arthurs, MD;  Location: [facility];  Service: Orthopedics;  Laterality: Right;  Fifth toe   LOWER EXTREMITY VENOGRAPHY Left 08/28/2020   Procedure: LOWER EXTREMITY VENOGRAPHY;  Surgeon: Maeola Harman, MD;  Location: [facility]VASIVE CV LAB;  Service: Cardiovascular;  Laterality: Left;   ORIF FOREARM FRACTURE     PERIPHERAL VASCULAR INTERVENTION Left 08/28/2020   Procedure: PERIPHERAL VASCULAR INTERVENTION;  Surgeon: Maeola Harman, MD;  Location: [facility] INVASIVE CV LAB;  Service: Cardiovascular;  Laterality: Left;  common iliac    Social History:  reports that she has never smoked. She has never used smokeless tobacco. She reports current alcohol use. She reports that she does not use drugs. Family History: family history includes Alcohol abuse in her maternal aunt and paternal aunt; Anxiety disorder in her brother; Bipolar disorder in her brother; Depression in her brother; Suicidality in her brother and maternal grandmother.   HOME MEDICATIONS: Allergies as of 03/27/2023       Reactions   Erythromycin Hives   Lactose Diarrhea   Aspirin Other (See Comments)   bleeding   Atovaquone-proguanil Hcl Nausea Only   Other Hives   Surgical glue   Penicillins Hives, Rash   Reaction: Childhood   Erythromycin Base Hives, Rash      Morphine And  Codeine Hives, Rash   Sulfa Antibiotics Hives, Rash      Sulfamethoxazole-trimethoprim Hives, Rash           Medication List        Accurate as of March 27, 2023 11:10 AM. If you have any questions, ask your nurse or doctor.          STOP taking these medications    desvenlafaxine 100 MG 24 hr tablet Commonly known as: PRISTIQ Stopped by: Monica Perez   lamoTRIgine 100 MG tablet Commonly known as: LaMICtal Stopped by: Monica Perez   lamoTRIgine 25 MG  tablet Commonly known as: LaMICtal Stopped by: Monica Perez       TAKE these medications    atorvastatin 40 MG tablet Commonly known as: LIPITOR Take 40 mg by mouth daily.   clopidogrel 75 MG tablet Commonly known as: PLAVIX Take 1 tablet (75 mg total) by mouth daily.   clotrimazole 10 MG troche Commonly known as: MYCELEX Take by mouth.   LORazepam 0.5 MG tablet Commonly known as: ATIVAN   Mounjaro 7.5 MG/0.5ML Pen Generic drug: tirzepatide Inject 7.5 mg into the skin once a week.   MULTI-VITAMIN DAILY PO Take 1 tablet by mouth daily.   Trintellix 10 MG Tabs tablet Generic drug: vortioxetine HBr Take 10 mg by mouth daily.   Vitamin D3 Super Strength 50 MCG (2000 UT) tablet Generic drug: Cholecalciferol Take 2,000 Units by mouth daily.   Vyvanse 40 MG capsule Generic drug: lisdexamfetamine Take 40 mg by mouth.          REVIEW OF SYSTEMS: A comprehensive ROS was conducted with the patient and is negative except as per HPI     OBJECTIVE:  VS: BP 106/70 (BP Location: Left Arm, Patient Position: Sitting, Cuff Size: Large)   Pulse 74   Ht 5\' 6"  (1.676 m)   Wt 197 lb (89.4 kg)   LMP 09/03/2011   SpO2 93%   BMI 31.80 kg/m    Wt Readings from Last 3 Encounters:  03/27/23 197 lb (89.4 kg)  06/12/22 223 lb 8 oz (101.4 kg)  03/26/22 224 lb 9.6 oz (101.9 kg)     EXAM: General: Pt appears well and is in NAD  Neck: General: Supple without adenopathy. Thyroid: Thyroid size normal.  No goiter or nodules appreciated.   Lungs: Clear with good BS bilat with no rales, rhonchi, or wheezes  Heart: Auscultation: RRR.  Abdomen: Soft, nontender  Extremities:  BL LE: No pretibial edema normal  Mental Status: Judgment, insight: Intact Orientation: Oriented to time, place, and person Mood and affect: No depression, anxiety, or agitation     DATA REVIEWED:     Latest Reference Range & Units 03/27/23 11:35  Sodium 135 - 145 mEq/L 142  Potassium 3.5 -  5.1 mEq/L 4.0  Chloride 96 - 112 mEq/L 105  CO2 19 - 32 mEq/L 28  Glucose 70 - 99 mg/dL 99  BUN 6 - 23 mg/dL 16  Creatinine 8.46 - 9.62 mg/dL 9.52  Calcium 8.4 - 84.1 mg/dL 9.9  Calcium Ionized 4.7 - 5.5 mg/dL 5.2  Alkaline Phosphatase 39 - 117 U/L 55  Albumin 3.5 - 5.2 g/dL 4.4  AST 0 - 37 U/L 19  ALT 0 - 35 U/L 21  Total Protein 6.0 - 8.3 g/dL 6.7  Total Bilirubin 0.2 - 1.2 mg/dL 0.8  GFR >32.44 mL/min 66.89  Total CHOL/HDL Ratio  3  Cholesterol 0 - 200 mg/dL 010  HDL  Cholesterol >39.00 mg/dL 60.45  LDL (calc) 0 - 99 mg/dL 75  NonHDL  409.81  Triglycerides 0.0 - 149.0 mg/dL 191.4  VLDL 0.0 - 78.2 mg/dL 95.6    Latest Reference Range & Units 03/27/23 11:35  WBC 4.0 - 10.5 K/uL 8.3  RBC 3.87 - 5.11 Mil/uL 4.42  Hemoglobin 12.0 - 15.0 g/dL 21.3  HCT 08.6 - 57.8 % 40.3  MCV 78.0 - 100.0 fl 91.3  MCHC 30.0 - 36.0 g/dL 46.9  RDW 62.9 - 52.8 % 14.4  Platelets 150.0 - 400.0 K/uL 293.0  Glucose 70 - 99 mg/dL 99  PTH, Intact 16 - 77 pg/mL 35   DXA 06/02/2020  Ap spine T -score -1.8   Z- score 2.3 RFN                      -2.1                 -1.9     DXA @[facility]  Everywhere 06/11/2022   T-Score Change 2021  AP Spine -0.9 Up 4%  RFN -2.0 Down 1.1%  L 1/3 radius +0.6 N/a    ASSESSMENT/PLAN/RECOMMENDATIONS:   Primary hyperparathyroidism:  - Pt asymptomatic  -24-hour urine excretion of calcium was normal at 140 mg/DL - DXA in 4132,4401  showed low bone density but no osteoporosis,  - Pt does NOT meet surgical intervention at this time  -Repeat labs today show normal CMP, PTH, vitamin D   Recommendations  - Stay Hydrated  - Continue Vitamin D 2000 iu daily  - Maintain 2-3 servings of dietary calcium daily       2. Low Bone density :  - No prior anti-resorptive treatment  - Maintain 2-3 servings of dietary calcium  - Continue Vitamin D 2000 iu daily  -DXA scan 2023 showed 4% improvement in BMD at the AP spine, but 1.1% decrease at the femoral neck    F/U  in 1 year  Signed electronically by: Lyndle Herrlich, MD  [facility]  [facility] [address] [place] [place], [place] [address] Phone: [phone] FAX: [phone]   CC: Verl Bangs, MD [address] [place] [address] Phone: [phone] Fax: [phone]   Return to Endocrinology clinic as below: Future Appointments  Date Time Provider Department Center  07/23/2023  8:30 AM [facility] 5 [facility]  07/23/2023  9:30 AM [facility]

## 2023-07-11 ENCOUNTER — Other Ambulatory Visit: Payer: Self-pay

## 2023-07-11 DIAGNOSIS — I871 Compression of vein: Secondary | ICD-10-CM

## 2023-07-23 ENCOUNTER — Ambulatory Visit (HOSPITAL_COMMUNITY)
Admission: RE | Admit: 2023-07-23 | Discharge: 2023-07-23 | Disposition: A | Discharge status: Home / Self Care | Payer: BC Managed Care – PPO | Source: Ambulatory Visit | Attending: Vascular Surgery | Admitting: Vascular Surgery

## 2023-07-23 ENCOUNTER — Ambulatory Visit (INDEPENDENT_AMBULATORY_CARE_PROVIDER_SITE_OTHER): Payer: BC Managed Care – PPO | Admitting: Physician Assistant

## 2023-07-23 VITALS — BP 112/80 | HR 91 | Temp 97.7°F | Ht 66.0 in | Wt 179.8 lb

## 2023-07-23 DIAGNOSIS — I871 Compression of vein: Secondary | ICD-10-CM | POA: Insufficient documentation

## 2023-07-23 MED ORDER — CLOPIDOGREL BISULFATE 75 MG PO TABS: 75.0000 mg | ORAL_TABLET | Freq: Every day | ORAL | 3 refills | Status: DC

## 2023-07-23 NOTE — Progress Notes (Signed)
HISTORY AND PHYSICAL     CC:  follow up. Requesting Provider:  Verl Bangs, *  HPI: This is a 57 y.o. 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  Pt has hx of left lower extremity swelling and pain, with remote history of DVT. She underwent left common and external iliac vein stenting on 08/28/2020 by Dr. Randie Heinz.   Pt was last seen 06/12/2022 and at that time, she was doing well without issues. She was staying very active.  Her IVC iliac duplex revealed her IVC, left common iliac and left external iliac arteries are patent. There is no sign of stent stenosis.  She was scheduled for one year follow up.  The pt returns today for follow up.  She is doing quite well.  She states that her leg is doing great without any swelling.  She [place] her compression on the airplane.  She is compliant with her plavix.    The pt is on a statin for cholesterol management.    The pt is not on an aspirin.    Other AC:  Plavix The pt is not on medication for hypertension.  The pt is on medication for diabetes. Tobacco hx:  never  Pt does not have family hx of AAA.  Past Medical History:  Diagnosis Date   Anxiety    DVT of lower extremity (deep venous thrombosis) (HCC)    Hyperlipemia    Peripheral arterial disease (HCC)    PONV (postoperative nausea and vomiting)     Past Surgical History:  Procedure Laterality Date   APPENDECTOMY     BUNIONECTOMY Right 07/20/2020   Procedure: Excision right bunionette;  Surgeon: Toni Arthurs, MD;  Location: [facility];  Service: Orthopedics;  Laterality: Right;  Fifth toe   LOWER EXTREMITY VENOGRAPHY Left 08/28/2020   Procedure: LOWER EXTREMITY VENOGRAPHY;  Surgeon: Maeola Harman, MD;  Location: [facility] INVASIVE CV LAB;  Service: Cardiovascular;  Laterality: Left;   ORIF FOREARM FRACTURE     PERIPHERAL VASCULAR INTERVENTION Left 08/28/2020   Procedure: PERIPHERAL VASCULAR INTERVENTION;  Surgeon: Maeola Harman, MD;  Location: [facility] INVASIVE CV LAB;  Service: Cardiovascular;  Laterality: Left;  common iliac    Allergies  Allergen Reactions   Erythromycin Hives   Lactose Diarrhea   Aspirin Other (See Comments)    bleeding   Atovaquone-Proguanil Hcl Nausea Only   Other Hives    Surgical glue   Penicillins Hives and Rash    Reaction: Childhood    Erythromycin Base Hives and Rash        Morphine And Codeine Hives and Rash   Sulfa Antibiotics Hives and Rash        Sulfamethoxazole-Trimethoprim Hives and Rash         Current Outpatient Medications  Medication Sig Dispense Refill   atorvastatin (LIPITOR) 40 MG tablet Take 40 mg by mouth daily.     Cholecalciferol (VITAMIN D3 SUPER STRENGTH) 50 MCG (2000 UT) TABS Take 2,000 Units by mouth daily.     clopidogrel (PLAVIX) 75 MG tablet Take 1 tablet (75 mg total) by mouth daily. 90 tablet 3   clotrimazole (MYCELEX) 10 MG troche Take by mouth.     LORazepam (ATIVAN) 0.5 MG tablet      MOUNJARO 7.5 MG/0.5ML Pen Inject 7.5 mg into the skin once a week.     Multiple Vitamin (MULTI-VITAMIN DAILY PO) Take 1 tablet by mouth daily.     TRINTELLIX 10 MG  TABS tablet Take 10 mg by mouth daily.     VYVANSE 40 MG capsule Take 40 mg by mouth.     Current Facility-Administered Medications  Medication Dose Route Frequency Provider Last Rate Last Admin   0.9 %  sodium chloride infusion  250 mL Intravenous PRN Nada Libman, MD       sodium chloride flush (NS) 0.9 % injection 3 mL  3 mL Intravenous Q12H Nada Libman, MD        Family History  Problem Relation Age of Onset   Anxiety disorder Brother    Depression Brother    Bipolar disorder Brother    Suicidality Brother    Alcohol abuse Maternal Aunt    Alcohol abuse Paternal Aunt    Suicidality Maternal Grandmother     Social History   Socioeconomic History   Marital status: Married    Spouse name: Not on file   Number of children: 0   Years of education: 19   Highest  education level: Doctorate  Occupational History   Occupation: Pensions consultant    Comment: Pharmacologist  Tobacco Use   Smoking status: Never   Smokeless tobacco: Never  Vaping Use   Vaping status: Never Used  Substance and Sexual Activity   Alcohol use: Yes    Comment: light social drinker   Drug use: Never   Sexual activity: Yes  Other Topics Concern   Not on file  Social History Narrative   Lives in [place]    Social Determinants of Health   Financial Resource Strain: Low Risk  (10/18/2022)   Received from [facility], [facility]   Overall Financial Resource Strain (CARDIA)    Difficulty of Paying Living Expenses: Not hard at all  Food Insecurity: No Food Insecurity (10/18/2022)   Received from [facility], [facility]   Hunger Vital Sign    Worried About Running Out of Food in the Last Year: Never true    Ran Out of Food in the Last Year: Never true  Transportation Needs: No Transportation Needs (10/18/2022)   Received from [facility], [facility]   PRAPARE - Transportation    Lack of Transportation (Medical): No    Lack of Transportation (Non-Medical): No  Physical Activity: Inactive (10/18/2022)   Received from [facility], [facility]   Exercise Vital Sign    Days of Exercise per Week: 0 days    Minutes of Exercise per Session: 0 min  Stress: No Stress Concern Present (10/18/2022)   Received from [facility] - Occupational Stress Questionnaire    Feeling of Stress : Not at all  Recent Concern: Stress - Stress Concern Present (09/26/2022)   Received from [facility] of Occupational Health - Occupational Stress Questionnaire    Feeling of Stress : To some extent  Social Connections: Socially Integrated (10/18/2022)   Received from [facility], [facility]   Social Network    How would you rate your social network (family, work, friends)?: Good  participation with social networks  Intimate Partner Violence: Not At Risk (10/18/2022)   Received from [facility], [facility]   HITS    Over the last 12 months how often did your partner physically hurt you?: Never    Over the last 12 months how often did your partner insult you or talk down to you?: Never    Over the last 12 months  how often did your partner threaten you with physical harm?: Never    Over the last 12 months how often did your partner scream or curse at you?: Never     REVIEW OF SYSTEMS:   [X]  denotes positive finding, [ ]  denotes negative finding Cardiac  Comments:  Chest pain or chest pressure:    Shortness of breath upon exertion:    Short of breath when lying flat:    Irregular heart rhythm:        Vascular    Pain in calf, thigh, or hip brought on by ambulation:    Pain in feet at night that wakes you up from your sleep:     Blood clot in your veins:    Leg swelling:         Pulmonary    Oxygen at home:    Productive cough:     Wheezing:         Neurologic    Sudden weakness in arms or legs:     Sudden numbness in arms or legs:     Sudden onset of difficulty speaking or slurred speech:    Temporary loss of vision in one eye:     Problems with dizziness:         Gastrointestinal    Blood in stool:     Vomited blood:         Genitourinary    Burning when urinating:     Blood in urine:        Psychiatric    Major depression:         Hematologic    Bleeding problems:    Problems with blood clotting too easily:        Skin    Rashes or ulcers:        Constitutional    Fever or chills:      PHYSICAL EXAMINATION:  Today's Vitals   07/23/23 0914  BP: 112/80  Pulse: 91  Temp: 97.7 F (36.5 C)  TempSrc: Temporal  SpO2: 97%  Weight: 179 lb 12.8 oz (81.6 kg)  Height: 5\' 6"  (1.676 m)   Body mass index is 29.02 kg/m.   General:  WDWN in NAD; vital signs documented above Gait: Not observed HENT: WNL, normocephalic Pulmonary:  normal non-labored breathing , without wheezing Cardiac: regular HR, without carotid bruits Abdomen: soft, NT; aortic pulse is not palpable Skin: without rashes Vascular Exam/Pulses:  Right Left  Radial 2+ (normal) 2+ (normal)  DP Brisk multiphasic Brisk multiphasic  PT Brisk multiphasic Brisk multiphasic   Extremities: without ischemic changes, with Gangrene , without cellulitis; without open wounds Musculoskeletal: no muscle wasting or atrophy  Neurologic: A&O X 3 Psychiatric:  The pt has Normal affect.   Non-Invasive Vascular Imaging:   Venous duplex on 07/23/2023: Summary:  IVC/Iliac: There is no evidence of thrombus involving the IVC. There is no evidence of thrombus involving the left common iliac vein. There is no evidence of thrombus involving the left external iliac vein.  Patent Left Common iliac and External Iliac vein stent   Previous Venous duplex on 06/12/2022: Summary:  IVC/Iliac: No evidence of visualized thrombus in IVC and Iliac veins. The left common and external iliac vein stenting appears patent, limited  visualization. This was somewhat of a technically difficult exam.     ASSESSMENT/PLAN:: 57 y.o. 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  Pt has hx of left lower extremity swelling and pain, with remote history of DVT.  She underwent left common and external iliac vein stenting on 08/28/2020 by Dr. Randie Heinz.    -pt doing well and remains asymptomatic without swelling.  Her left CIV and EIV stents are patent.   -continue plavix as she is allergic to asa.  Rx sent to pharmacy for Plavix 75mg  daily #90 with three refills.  -pt will f/u in one year with IVC iliac duplex.  She knows to call sooner if any issues before then.   Doreatha Massed, [facility] [phone]  Clinic MD:   Randie Heinz

## 2023-07-28 ENCOUNTER — Other Ambulatory Visit: Payer: Self-pay

## 2023-07-28 DIAGNOSIS — I871 Compression of vein: Secondary | ICD-10-CM

## 2023-08-26 IMAGING — DX DG CHEST 2V
2 series · 2 of 2 positions shown · non-contrast
Comparison: Chest radiograph 09/24/2011

CLINICAL DATA: COVID positive, cough

EXAM:
CHEST - 2 VIEW

[chest pa]
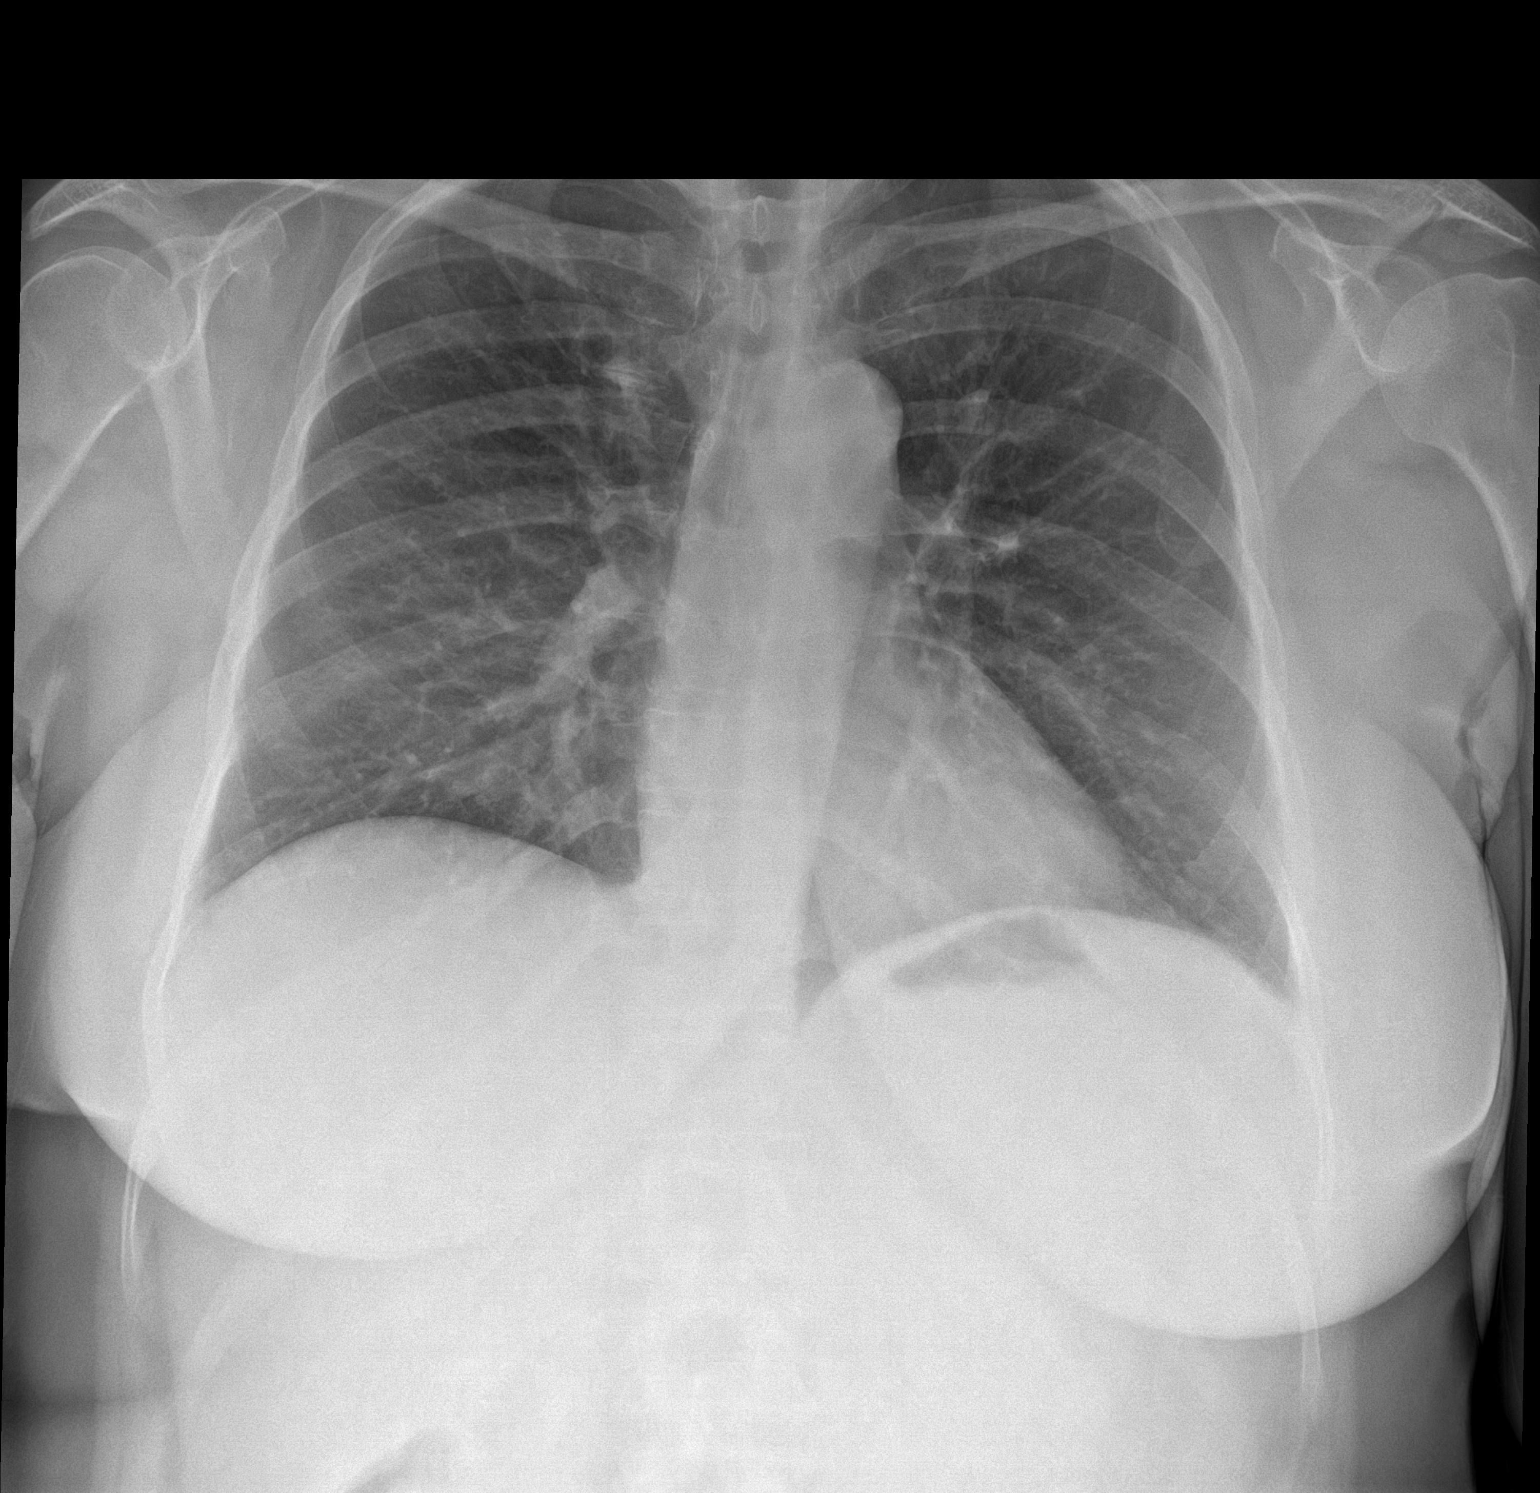

[chest lat]
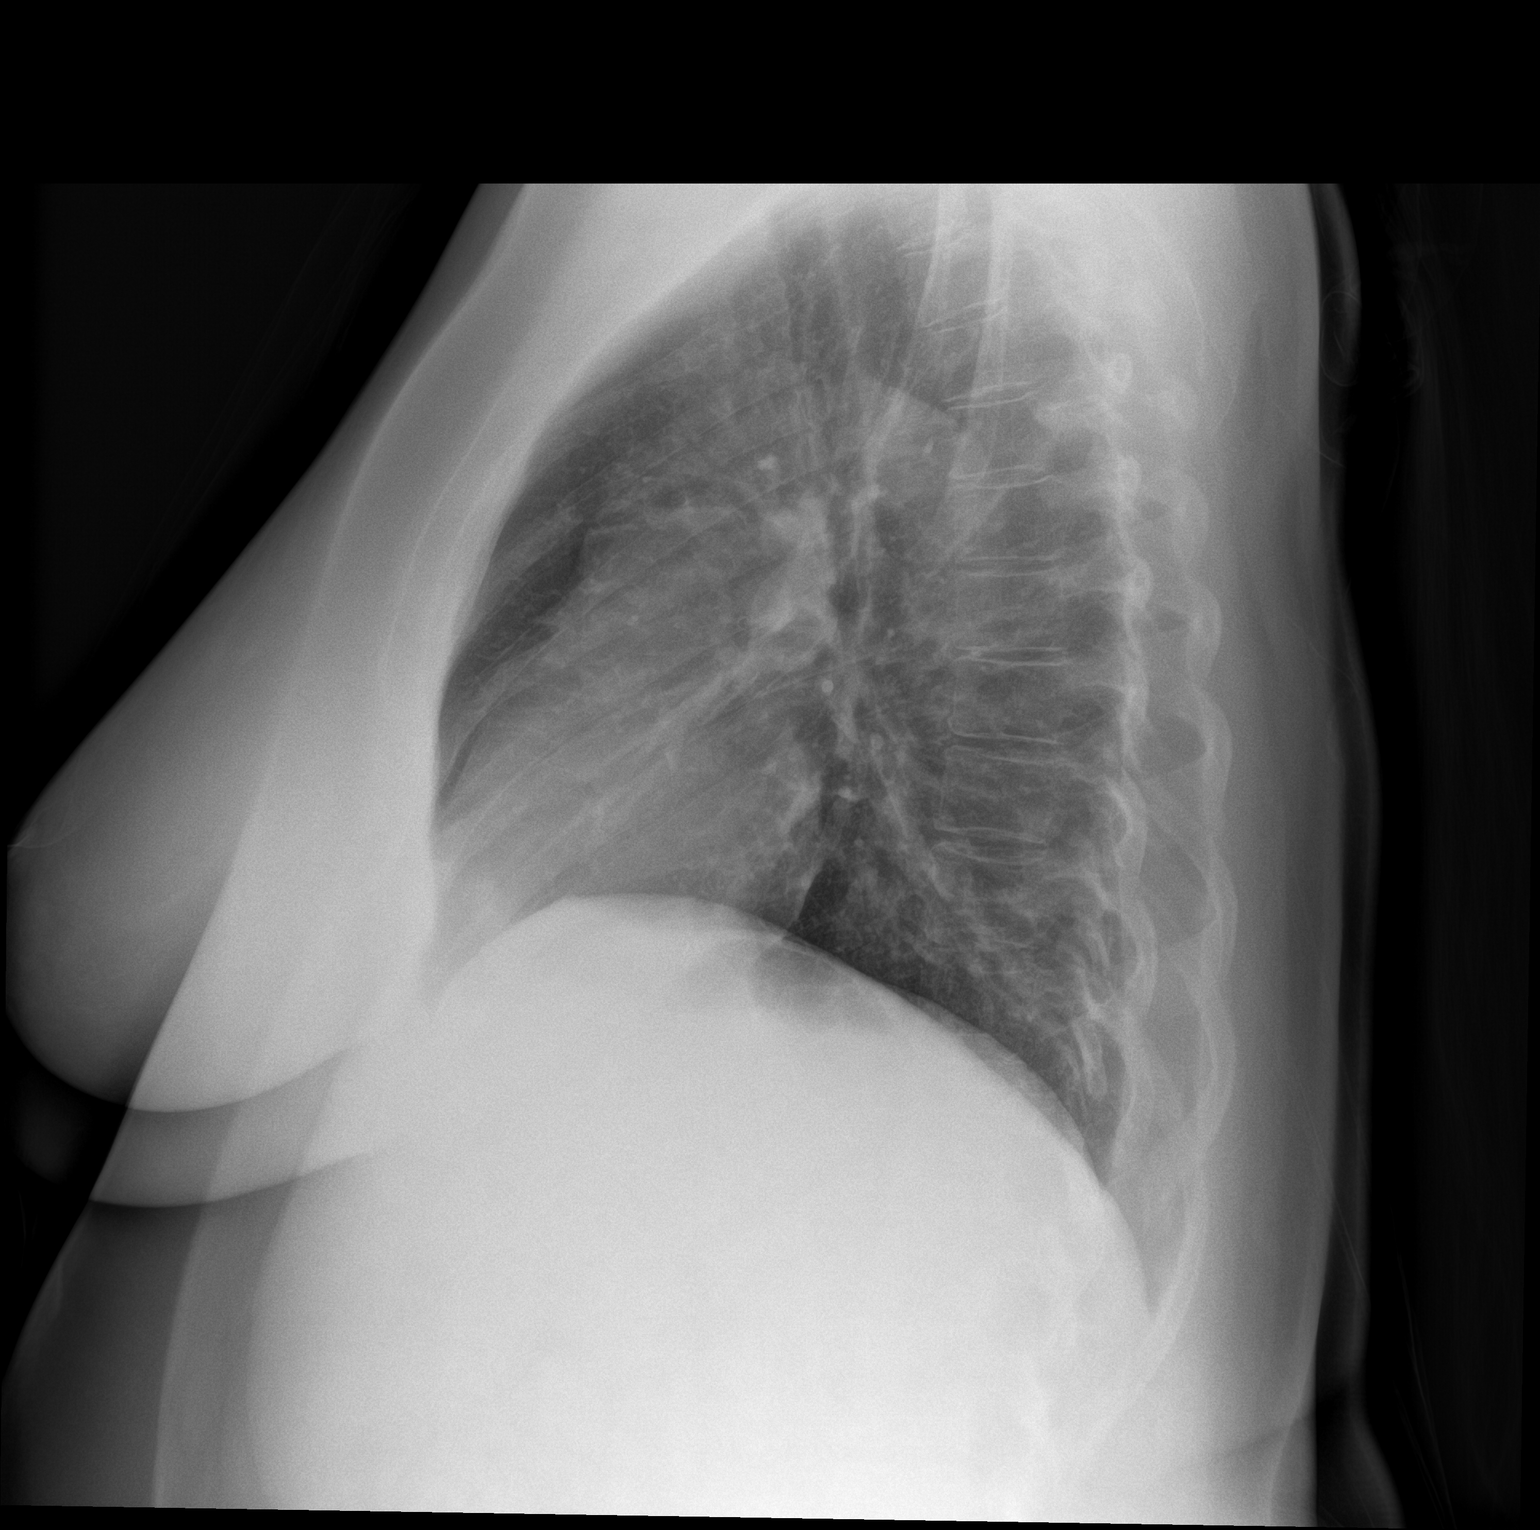

[2 of 2 positions shown; findings below may reference images not displayed]

FINDINGS: The cardiomediastinal silhouette is within normal limits. There is
no focal consolidation or pulmonary edema. There is no pleural
effusion or pneumothorax.

There is no acute osseous abnormality.
IMPRESSION: No radiographic evidence of acute cardiopulmonary process.

## 2024-02-23 ENCOUNTER — Other Ambulatory Visit: Payer: Self-pay | Admitting: Physician Assistant

## 2024-02-23 MED ORDER — CLOPIDOGREL BISULFATE 75 MG PO TABS: 75.0000 mg | ORAL_TABLET | Freq: Every day | ORAL | 3 refills | Status: DC

## 2024-03-25 ENCOUNTER — Ambulatory Visit: Payer: BC Managed Care – PPO | Admitting: Internal Medicine

## 2024-03-26 ENCOUNTER — Ambulatory Visit: Payer: BC Managed Care – PPO | Admitting: Internal Medicine

## 2024-04-01 ENCOUNTER — Encounter: Payer: Self-pay | Admitting: Internal Medicine

## 2024-04-01 ENCOUNTER — Ambulatory Visit: Payer: BC Managed Care – PPO | Admitting: Internal Medicine

## 2024-04-01 VITALS — BP 120/70 | HR 74 | Ht 66.0 in | Wt 160.0 lb

## 2024-04-01 DIAGNOSIS — E785 Hyperlipidemia, unspecified: Secondary | ICD-10-CM | POA: Diagnosis not present

## 2024-04-01 DIAGNOSIS — E213 Hyperparathyroidism, unspecified: Secondary | ICD-10-CM

## 2024-04-01 DIAGNOSIS — R7303 Prediabetes: Secondary | ICD-10-CM

## 2024-04-01 DIAGNOSIS — M859 Disorder of bone density and structure, unspecified: Secondary | ICD-10-CM

## 2024-04-01 DIAGNOSIS — E119 Type 2 diabetes mellitus without complications: Secondary | ICD-10-CM | POA: Insufficient documentation

## 2024-04-01 NOTE — Patient Instructions (Signed)
-   Stay Hydrated  - Continue Vitamin D 2000 iu daily  - Maintain 2-3 servings of dietary calcium daily  - Avoid over the counter calcium tablets

## 2024-04-01 NOTE — Progress Notes (Unsigned)
 Name: Monica Perez  MRN/ DOB: [id number], [birth year]    Age/ Sex: 58 y.o., female    PCP: Radiontchenko, Alexei, MD   Reason for Endocrinology Evaluation: Hypercalcemia      Date of Initial Endocrinology Evaluation: 05/29/2021    HPI: Monica Perez is a 58 y.o. female with a past medical history of Dyslipidemia, Depression and anxiety . The patient presented for initial endocrinology clinic visit on 05/29/2021 for consultative assistance with her Hypercalcemia .     Monica Perez indicates that she was diagnosed with hypercalcemia in 03/2021 with a serum calcium of 11.0 mg/dL , albumin 4.8 g/dL. Since that time, she denies  experienced symptoms of constipation, polyuria, polydipsia. She takes  use of over the counter calcium (MVI and every other day calcium ), lithium, or HCTZ.     She denies  history of kidney stones, kidney disease, or granulomatous disease.She has elevated LFT's, was though fatty liver , after losing 15 lbs her lFT's improved .  She has osteoporosis but DXA 2021 showed low bone density she  had a broken arm at age 67 and toes as an adult.   She denies family history of osteoporosis, parathyroid  disease.   Questionable FH of thyroid  disease in mother   24-hour urine calcium excretion 140 mg/DL  SUBJECTIVE:    Today (04/01/24):  Monica Perez on hypercalcemia secondary to hyperparathyroidism.    Patient follows with [facility] for weight management, on Mounjaro 5 mg weekly  Has lost almost ~40 lb weight loss since 03/2023  She does have nausea and occasional vomiting due to mounjaro  Denies local neck swelling  Has occasional palpitations  She stays hydrated  Denies renal stones  Denies polydipsia Has constipation due to [facility], has been improving , on benefiber    She consumes 2-3  servings of calcium  Vitamin D  2000 iu daily  MVI with no calcium      HISTORY:  Past Medical History:  Past Medical  History:  Diagnosis Date   Anxiety    DVT of lower extremity (deep venous thrombosis) (HCC)    Hyperlipemia    Peripheral arterial disease (HCC)    PONV (postoperative nausea and vomiting)    Past Surgical History:  Past Surgical History:  Procedure Laterality Date   APPENDECTOMY     BUNIONECTOMY Right 07/20/2020   Procedure: Excision right bunionette;  Surgeon: Kit Rush, MD;  Location: [facility];  Service: Orthopedics;  Laterality: Right;  Fifth toe   LOWER EXTREMITY VENOGRAPHY Left 08/28/2020   Procedure: LOWER EXTREMITY VENOGRAPHY;  Surgeon: Sheree Penne Bruckner, MD;  Location: [facility] INVASIVE CV LAB;  Service: Cardiovascular;  Laterality: Left;   ORIF FOREARM FRACTURE     PERIPHERAL VASCULAR INTERVENTION Left 08/28/2020   Procedure: PERIPHERAL VASCULAR INTERVENTION;  Surgeon: Sheree Penne Bruckner, MD;  Location: [facility] INVASIVE CV LAB;  Service: Cardiovascular;  Laterality: Left;  common iliac    Social History:  reports that she has never smoked. She has never used smokeless tobacco. She reports current alcohol use. She reports that she does not use drugs. Family History: family history includes Alcohol abuse in her maternal aunt and paternal aunt; Anxiety disorder in her brother; Bipolar disorder in her brother; Depression in her brother; Suicidality in her brother and maternal grandmother.   HOME MEDICATIONS: Allergies as of 04/01/2024       Reactions   Erythromycin Hives   Lactose Diarrhea  Aspirin Other (See Comments)   bleeding   Atovaquone-proguanil Hcl Nausea Only   Other Hives   Surgical glue   Penicillins Hives, Rash   Reaction: Childhood   Erythromycin Base Hives, Rash      Morphine And Codeine Hives, Rash   Sulfa Antibiotics Hives, Rash      Sulfamethoxazole-trimethoprim Hives, Rash           Medication List        Accurate as of April 01, 2024 11:00 AM. If you have any questions, ask your nurse or doctor.          STOP  taking these medications    clotrimazole  10 MG troche Commonly known as: MYCELEX  Stopped by: Sims Laday J Kalayah Leske   Trintellix 10 MG Tabs tablet Generic drug: vortioxetine HBr Stopped by: Sylvia Helms J Trygve Thal       TAKE these medications    atorvastatin 40 MG tablet Commonly known as: LIPITOR Take 40 mg by mouth daily.   clopidogrel  75 MG tablet Commonly known as: PLAVIX  Take 1 tablet (75 mg total) by mouth daily.   Cymbalta 60 MG capsule Generic drug: DULoxetine   LORazepam 0.5 MG tablet Commonly known as: ATIVAN   Mounjaro 7.5 MG/0.5ML Pen Generic drug: tirzepatide Inject 7.5 mg into the skin once a week.   MULTI-VITAMIN DAILY PO Take 1 tablet by mouth daily.   Vitamin D3 Super Strength 50 MCG (2000 UT) tablet Generic drug: Cholecalciferol  Take 2,000 Units by mouth daily.   Vyvanse 40 MG capsule Generic drug: lisdexamfetamine Take 40 mg by mouth.          REVIEW OF SYSTEMS: A comprehensive ROS was conducted with the patient and is negative except as per HPI     OBJECTIVE:  VS: BP 120/70 (BP Location: Left Arm, Patient Position: Sitting, Cuff Size: Normal)   Pulse 74   LMP 09/03/2011    Wt Readings from Last 3 Encounters:  07/23/23 179 lb 12.8 oz (81.6 kg)  03/27/23 197 lb (89.4 kg)  06/12/22 223 lb 8 oz (101.4 kg)     EXAM: General: Pt appears well and is in NAD  Neck: General: Supple without adenopathy. Thyroid : Thyroid  size normal.  No goiter or nodules appreciated.   Lungs: Clear with good BS bilat with no rales, rhonchi, or wheezes  Heart: Auscultation: RRR.  Abdomen: Soft, nontender  Extremities:  BL LE: No pretibial edema normal  Mental Status: Judgment, insight: Intact Orientation: Oriented to time, place, and person Mood and affect: No depression, anxiety, or agitation     DATA REVIEWED:     Latest Reference Range & Units 03/27/23 11:35  Sodium 135 - 145 mEq/L 142  Potassium 3.5 - 5.1 mEq/L 4.0  Chloride 96 - 112 mEq/L  105  CO2 19 - 32 mEq/L 28  Glucose 70 - 99 mg/dL 99  BUN 6 - 23 mg/dL 16  Creatinine 9.59 - 8.79 mg/dL 9.04  Calcium 8.4 - 89.4 mg/dL 9.9  Calcium Ionized 4.7 - 5.5 mg/dL 5.2  Alkaline Phosphatase 39 - 117 U/L 55  Albumin 3.5 - 5.2 g/dL 4.4  AST 0 - 37 U/L 19  ALT 0 - 35 U/L 21  Total Protein 6.0 - 8.3 g/dL 6.7  Total Bilirubin 0.2 - 1.2 mg/dL 0.8  GFR >39.99 mL/min 66.89  Total CHOL/HDL Ratio  3  Cholesterol 0 - 200 mg/dL 846  HDL Cholesterol >60.99 mg/dL 50.39  LDL (calc) 0 - 99 mg/dL 75  NonHDL  896.27  Triglycerides 0.0 - 149.0 mg/dL 855.9  VLDL 0.0 - 59.9 mg/dL 71.1    Latest Reference Range & Units 03/27/23 11:35  WBC 4.0 - 10.5 K/uL 8.3  RBC 3.87 - 5.11 Mil/uL 4.42  Hemoglobin 12.0 - 15.0 g/dL 86.9  HCT 63.9 - 53.9 % 40.3  MCV 78.0 - 100.0 fl 91.3  MCHC 30.0 - 36.0 g/dL 67.6  RDW 88.4 - 84.4 % 14.4  Platelets 150.0 - 400.0 K/uL 293.0  Glucose 70 - 99 mg/dL 99  PTH, Intact 16 - 77 pg/mL 35   DXA 06/02/2020  Ap spine T -score -1.8   Z- score 2.3 RFN                      -2.1                 -1.9     DXA [facility]  Everywhere 06/11/2022 @ [facility]    T-Score Change 2021  AP Spine -0.9 Perez 4%  RFN -2.0 Down 1.1%  L 1/3 radius +0.6 N/a    ASSESSMENT/PLAN/RECOMMENDATIONS:   Primary hyperparathyroidism:  - Pt asymptomatic  -24-hour urine excretion of calcium was normal at 140 mg/DL - DXA in 7978,7976  showed low bone density but no osteoporosis,  - Pt does NOT meet surgical intervention at this time  -Repeat labs today show normal CMP, PTH, vitamin D    Recommendations  - Stay Hydrated  - Continue Vitamin D  2000 iu daily  - Maintain 2-3 servings of dietary calcium daily       2. Low Bone density :  - No prior anti-resorptive treatment  - Maintain 2-3 servings of dietary calcium  - Continue Vitamin D  2000 iu daily  -DXA scan 2023 showed 4% improvement in BMD at the AP spine, but 1.1% decrease at the femoral neck    F/U in 1 year  Signed  electronically by: Stefano Redgie Butts, MD  [facility] Endocrinology  [facility] [address] [place] [place], [place] [address] Phone: [phone] FAX: [phone]   CC: Nile Harvard, MD [address] [place] [address] Phone: [phone] Fax: [phone]   Return to Endocrinology clinic as below: Future Appointments  Date Time Provider Department Center  12/15/2024  9:00 AM [facility]  12/15/2024  9:40 AM Sheree Penne Bruckner, MD [facility]

## 2024-04-02 ENCOUNTER — Ambulatory Visit: Payer: Self-pay | Admitting: Internal Medicine

## 2024-04-02 LAB — LIPID PANEL
Cholesterol: 159 mg/dL (ref <200)
HDL: 57 mg/dL (ref >=50)
LDL Cholesterol (Calc): 84 mg/dL
Non-HDL Cholesterol (Calc): 102 mg/dL (ref <130)
Total CHOL/HDL Ratio: 2.8 (calc) (ref <5.0)
Triglycerides: 87 mg/dL (ref <150)

## 2024-04-02 LAB — CBC
HCT: 40.4 % (ref 35.0–45.0)
Hemoglobin: 12.8 g/dL (ref 11.7–15.5)
MCH: 29.7 pg (ref 27.0–33.0)
MCHC: 31.7 g/dL — ABNORMAL LOW (ref 32.0–36.0)
MCV: 93.7 fL (ref 80.0–100.0)
MPV: 10.9 fL (ref 7.5–12.5)
Platelets: 272 Thousand/uL (ref 140–400)
RBC: 4.31 Million/uL (ref 3.80–5.10)
RDW: 12.5 % (ref 11.0–15.0)
WBC: 7 Thousand/uL (ref 3.8–10.8)

## 2024-04-02 LAB — HEMOGLOBIN A1C
Hgb A1c MFr Bld: 5.7 % — ABNORMAL HIGH (ref <5.7)
Mean Plasma Glucose: 117 mg/dL
eAG (mmol/L): 6.5 mmol/L

## 2024-04-02 LAB — COMPREHENSIVE METABOLIC PANEL WITH GFR
AG Ratio: 2.6 (calc) — ABNORMAL HIGH (ref 1.0–2.5)
ALT: 27 U/L (ref 6–29)
AST: 20 U/L (ref 10–35)
Albumin: 4.7 g/dL (ref 3.6–5.1)
Alkaline phosphatase (APISO): 43 U/L (ref 37–153)
BUN: 14 mg/dL (ref 7–25)
CO2: 27 mmol/L (ref 20–32)
Calcium: 9.8 mg/dL (ref 8.6–10.4)
Chloride: 104 mmol/L (ref 98–110)
Creat: 0.72 mg/dL (ref 0.50–1.03)
Globulin: 1.8 g/dL — ABNORMAL LOW (ref 1.9–3.7)
Glucose, Bld: 94 mg/dL (ref 65–99)
Potassium: 4.2 mmol/L (ref 3.5–5.3)
Sodium: 141 mmol/L (ref 135–146)
Total Bilirubin: 0.8 mg/dL (ref 0.2–1.2)
Total Protein: 6.5 g/dL (ref 6.1–8.1)
eGFR: 97 mL/min/1.73m2 (ref >=60)

## 2024-04-02 LAB — VITAMIN D 25 HYDROXY (VIT D DEFICIENCY, FRACTURES): Vit D, 25-Hydroxy: 59 ng/mL (ref 30–100)

## 2024-04-02 LAB — PARATHYROID HORMONE, INTACT (NO CA): PTH: 58 pg/mL (ref 16–77)

## 2024-04-02 LAB — T4, FREE: Free T4: 1.2 ng/dL (ref 0.8–1.8)

## 2024-04-02 LAB — TSH: TSH: 2.11 m[IU]/L (ref 0.40–4.50)

## 2024-04-19 ENCOUNTER — Other Ambulatory Visit: Payer: Self-pay

## 2024-04-19 MED ORDER — CLOPIDOGREL BISULFATE 75 MG PO TABS: 75.0000 mg | ORAL_TABLET | Freq: Every day | ORAL | 3 refills | Status: AC

## 2024-04-24 ENCOUNTER — Telehealth

## 2024-12-15 ENCOUNTER — Ambulatory Visit: Admitting: Vascular Surgery

## 2024-12-15 ENCOUNTER — Encounter (HOSPITAL_COMMUNITY)

## 2025-04-01 ENCOUNTER — Ambulatory Visit: Admitting: Internal Medicine
